# Patient Record
Sex: Female | Born: 1970
Health system: Southern US, Community
[De-identification: ages and names within clinical notes are randomized; demographics above are authoritative.]

## PROBLEM LIST (undated history)

## (undated) DIAGNOSIS — T7840XA Allergy, unspecified, initial encounter: Secondary | ICD-10-CM

## (undated) HISTORY — DX: Allergy, unspecified, initial encounter: T78.40XA

## (undated) HISTORY — PX: BREAST ENHANCEMENT SURGERY: SHX7

## (undated) HISTORY — PX: APPENDECTOMY: SHX54

---

## 1997-01-07 HISTORY — PX: DILATION AND CURETTAGE OF UTERUS: SHX78

## 1997-01-07 HISTORY — PX: ABDOMINAL HYSTERECTOMY: SHX81

## 2007-06-26 ENCOUNTER — Other Ambulatory Visit: Admission: RE | Admit: 2007-06-26 | Discharge: 2007-06-26 | Payer: Self-pay | Admitting: Family Medicine

## 2008-01-08 HISTORY — PX: ESOPHAGOGASTRODUODENOSCOPY: SHX1529

## 2008-12-28 ENCOUNTER — Encounter: Admission: RE | Admit: 2008-12-28 | Discharge: 2008-12-28 | Payer: Self-pay | Admitting: Gastroenterology

## 2009-02-23 ENCOUNTER — Emergency Department: Payer: Self-pay | Admitting: Emergency Medicine

## 2009-06-17 ENCOUNTER — Emergency Department: Payer: Self-pay | Admitting: Emergency Medicine

## 2010-01-28 ENCOUNTER — Encounter: Payer: Self-pay | Admitting: Gastroenterology

## 2010-06-19 ENCOUNTER — Emergency Department (HOSPITAL_COMMUNITY)
Admission: EM | Admit: 2010-06-19 | Discharge: 2010-06-19 | Disposition: A | Discharge status: Home / Self Care | Payer: PRIVATE HEALTH INSURANCE | Attending: Emergency Medicine | Admitting: Emergency Medicine

## 2010-06-19 ENCOUNTER — Emergency Department (HOSPITAL_COMMUNITY): Payer: PRIVATE HEALTH INSURANCE

## 2010-06-19 DIAGNOSIS — R209 Unspecified disturbances of skin sensation: Secondary | ICD-10-CM | POA: Insufficient documentation

## 2010-06-19 DIAGNOSIS — M549 Dorsalgia, unspecified: Secondary | ICD-10-CM | POA: Insufficient documentation

## 2010-06-19 DIAGNOSIS — R079 Chest pain, unspecified: Secondary | ICD-10-CM | POA: Insufficient documentation

## 2010-06-19 DIAGNOSIS — M25519 Pain in unspecified shoulder: Secondary | ICD-10-CM | POA: Insufficient documentation

## 2010-06-19 DIAGNOSIS — R110 Nausea: Secondary | ICD-10-CM | POA: Insufficient documentation

## 2010-06-19 LAB — DIFFERENTIAL
Basophils Absolute: 0 10*3/uL (ref 0.0–0.1)
Eosinophils Absolute: 0.2 10*3/uL (ref 0.0–0.7)
Monocytes Absolute: 0.3 10*3/uL (ref 0.1–1.0)

## 2010-06-19 LAB — CBC
Hemoglobin: 13.1 g/dL (ref 12.0–15.0)
MCHC: 34.3 g/dL (ref 30.0–36.0)
MCV: 98.5 fL (ref 78.0–100.0)
Platelets: 257 10*3/uL (ref 150–400)
WBC: 5.5 10*3/uL (ref 4.0–10.5)

## 2010-06-19 LAB — BASIC METABOLIC PANEL
BUN: 11 mg/dL (ref 6–23)
GFR calc Af Amer: >60 mL/min (ref >60)
GFR calc non Af Amer: >60 mL/min (ref >60)
Potassium: 3.9 mEq/L (ref 3.5–5.1)
Sodium: 139 mEq/L (ref 135–145)

## 2010-06-26 ENCOUNTER — Encounter: Payer: Self-pay | Admitting: Cardiovascular Disease

## 2010-06-27 ENCOUNTER — Ambulatory Visit (INDEPENDENT_AMBULATORY_CARE_PROVIDER_SITE_OTHER): Payer: PRIVATE HEALTH INSURANCE | Admitting: Cardiovascular Disease

## 2010-06-27 ENCOUNTER — Encounter: Payer: Self-pay | Admitting: Cardiovascular Disease

## 2010-06-27 DIAGNOSIS — R079 Chest pain, unspecified: Secondary | ICD-10-CM

## 2010-06-27 NOTE — Assessment & Plan Note (Signed)
Atypical pain in a young female with no cardiac risk factors. I do not think this is cardiac. She will call us if she has any recurrence of her chest pain.

## 2010-06-27 NOTE — Progress Notes (Signed)
History of Present Illness:40 yo AAF with no past medical history who is here today for evaluation of chest pressure. She was at home and doing schoolwork when she began to feel a heaviness in her chest. Described as a squeezing type pain. Position changes did not help. There was some radiation into her left arm. This episode lasted for 20-30 minutes. She was seen in the [facility] ED 06/19/10 and had negative cardiac enzymes, negative d-dimer. EKG showed sinus tachycardia. She has had no recurrence. The pain occurred when she was anxious, studying and drinking coffee.   No past medical history on file.  Past Surgical History  Procedure Date  . Breast enhancement surgery   . Abdominal hysterectomy   . Appendectomy     No current outpatient prescriptions on file.    Allergies  Allergen Reactions  . Morphine And Related     History   Social History  . Marital Status: Single    Spouse Name: N/A    Number of Children: N/A  . Years of Education: N/A   Occupational History  . Not on file.   Social History Main Topics  . Smoking status: Former Smoker    Quit date: 01/08/1991  . Smokeless tobacco: Not on file  . Alcohol Use: Not on file  . Drug Use: Not on file  . Sexually Active: Not on file   Other Topics Concern  . Not on file   Social History Narrative   non-smoker, no drug abuse, occasional drinker    Family History  Problem Relation Age of Onset  . Cancer Mother   . Diabetes Father     Review of Systems:  As stated in the HPI and otherwise negative.   BP 122/82  Pulse 74  Ht 5\' 10"  (1.778 m)  Wt 169 lb (76.658 kg)  BMI 24.25 kg/m2  Physical Examination: General: Well developed, well nourished, NAD HEENT: OP clear, mucus membranes moist SKIN: warm, dry. No rashes. Neuro: No focal deficits Musculoskeletal: Muscle strength 5/5 all ext Psychiatric: Mood and affect normal Neck: No JVD, no carotid bruits, no thyromegaly, no lymphadenopathy. Lungs:Clear  bilaterally, no wheezes, rhonci, crackles Cardiovascular: Regular rate and rhythm. No murmurs, gallops or rubs. Abdomen:Soft. Bowel sounds present. Non-tender.  Extremities: No lower extremity edema. Pulses are 2 + in the bilateral DP/PT.  EKG: Sinus tach 06/19/10.

## 2010-10-08 ENCOUNTER — Encounter: Payer: Self-pay | Admitting: Family Medicine

## 2010-10-08 ENCOUNTER — Ambulatory Visit (INDEPENDENT_AMBULATORY_CARE_PROVIDER_SITE_OTHER): Payer: PRIVATE HEALTH INSURANCE | Admitting: Family Medicine

## 2010-10-08 VITALS — BP 124/78 | HR 87 | Temp 99.1°F | Ht 69.75 in | Wt 169.0 lb

## 2010-10-08 DIAGNOSIS — Z0000 Encounter for general adult medical examination without abnormal findings: Secondary | ICD-10-CM

## 2010-10-08 NOTE — Progress Notes (Signed)
  Subjective:    Patient ID: Lori Copeland, female    DOB: [birth year], 40 y.o.   MRN: [id number]  HPI 40 yr old female to establish with [place] and for a cpx. She feels fine and has no complaints. She does have some questions about her family history and about what would be appropriate screens for her to get. Her mother died of stomach cancer at the age of 7, and her sister has ovarian cancer at the age of 56. Lori Copeland has had a TAH and has had one ovary removed. She has never had a mammogram.    Review of Systems  Constitutional: Negative.   HENT: Negative.   Eyes: Negative.   Respiratory: Negative.   Cardiovascular: Negative.   Gastrointestinal: Negative.   Genitourinary: Negative for dysuria, urgency, frequency, hematuria, flank pain, decreased urine volume, enuresis, difficulty urinating, pelvic pain and dyspareunia.  Musculoskeletal: Negative.   Skin: Negative.   Neurological: Negative.   Hematological: Negative.   Psychiatric/Behavioral: Negative.        Objective:   Physical Exam  Constitutional: She is oriented to person, place, and time. She appears well-developed and well-nourished. No distress.  HENT:  Head: Normocephalic and atraumatic.  Right Ear: External ear normal.  Left Ear: External ear normal.  Nose: Nose normal.  Mouth/Throat: Oropharynx is clear and moist. No oropharyngeal exudate.  Eyes: Conjunctivae and EOM are normal. Pupils are equal, round, and reactive to light. No scleral icterus.  Neck: Normal range of motion. Neck supple. No JVD present. No thyromegaly present.  Cardiovascular: Normal rate, regular rhythm, normal heart sounds and intact distal pulses.  Exam reveals no gallop and no friction rub.   No murmur heard. Pulmonary/Chest: Effort normal and breath sounds normal. No respiratory distress. She has no wheezes. She has no rales. She exhibits no tenderness.  Abdominal: Soft. Bowel sounds are normal. She exhibits no distension and no mass. There is no  tenderness. There is no rebound and no guarding.  Genitourinary: No breast swelling, tenderness, discharge or bleeding.  Musculoskeletal: Normal range of motion. She exhibits no edema and no tenderness.  Lymphadenopathy:    She has no cervical adenopathy.  Neurological: She is alert and oriented to person, place, and time. She has normal reflexes. No cranial nerve deficit. She exhibits normal muscle tone. Coordination normal.  Skin: Skin is warm and dry. No rash noted. No erythema.  Psychiatric: She has a normal mood and affect. Her behavior is normal. Judgment and thought content normal.          Assessment & Plan:  She will be having fasting labs drawn soon through a work related wellness program, and she will have a coopy of these sent to [place]. I wrote an order for her to add a CA-125 to these labs. She will get a mammogram soon.

## 2010-10-20 ENCOUNTER — Ambulatory Visit: Payer: Self-pay | Admitting: Family Medicine

## 2016-08-16 DIAGNOSIS — J040 Acute laryngitis: Secondary | ICD-10-CM | POA: Diagnosis not present

## 2016-08-16 DIAGNOSIS — J029 Acute pharyngitis, unspecified: Secondary | ICD-10-CM | POA: Diagnosis not present

## 2016-09-26 ENCOUNTER — Encounter: Payer: Self-pay | Admitting: Family Medicine

## 2017-07-06 ENCOUNTER — Ambulatory Visit: Payer: Self-pay | Admitting: Family Medicine

## 2017-07-06 VITALS — BP 122/80 | HR 105 | Temp 98.7°F | Resp 18 | Ht 70.0 in | Wt 185.2 lb

## 2017-07-06 DIAGNOSIS — Z0000 Encounter for general adult medical examination without abnormal findings: Secondary | ICD-10-CM

## 2017-07-06 NOTE — Progress Notes (Signed)
Lori Copeland is a 47 y.o. female who presents today with concerns of an employment physical exam. He PCP dr Abran CantorFrye but she reports she had not seen since 2012.   Review of Systems  Constitutional: Negative for chills, fever and malaise/fatigue.  HENT: Negative for congestion, ear discharge, ear pain, sinus pain and sore throat.   Eyes: Negative.   Respiratory: Negative for cough, sputum production and shortness of breath.   Cardiovascular: Negative.  Negative for chest pain.  Gastrointestinal: Negative for abdominal pain, diarrhea, nausea and vomiting.  Genitourinary: Negative for dysuria, frequency, hematuria and urgency.  Musculoskeletal: Negative for myalgias.  Skin: Negative.   Neurological: Negative for headaches.  Endo/Heme/Allergies: Negative.   Psychiatric/Behavioral: Negative.     O: Vitals:   07/06/17 1540  BP: 122/80  Pulse: (!) 105  Resp: 18  Temp: 98.7 F (37.1 C)  SpO2: 96%     Physical Exam  Constitutional: She is oriented to person, place, and time. Vital signs are normal. She appears well-developed and well-nourished. She is active.  Non-toxic appearance. She does not have a sickly appearance.  HENT:  Head: Normocephalic.  Right Ear: Hearing, tympanic membrane, external ear and ear canal normal.  Left Ear: Hearing, tympanic membrane, external ear and ear canal normal.  Nose: Nose normal.  Mouth/Throat: Uvula is midline and oropharynx is clear and moist.  Neck: Normal range of motion. Neck supple.  Cardiovascular: Normal rate, regular rhythm, normal heart sounds and normal pulses.  Pulmonary/Chest: Effort normal and breath sounds normal.  Abdominal: Soft. Bowel sounds are normal.  Musculoskeletal: Normal range of motion.  Lymphadenopathy:       Head (right side): No submental and no submandibular adenopathy present.       Head (left side): No submental and no submandibular adenopathy present.    She has no cervical adenopathy.  Neurological: She is alert  and oriented to person, place, and time.  Psychiatric: She has a normal mood and affect. Her speech is normal and behavior is normal. Cognition and memory are normal.  PHQ-9- negative  Vitals reviewed.  A: 1. Physical exam    P: Exam findings, diagnosis etiology and medication use and indications reviewed with patient. Follow- Up and discharge instructions provided. No emergent/urgent issues found on exam.  Patient verbalized understanding of information provided and agrees with plan of care (POC), all questions answered.  1. Physical exam WNL

## 2017-07-06 NOTE — Patient Instructions (Addendum)
Food Choices to Help Relieve Diarrhea, Adult When you have diarrhea, the foods you eat and your eating habits are very important. Choosing the right foods and drinks can help:  Relieve diarrhea.  Replace lost fluids and nutrients.  Prevent dehydration.  What general guidelines should I follow? Relieving diarrhea  Choose foods with less than 2 g or .07 oz. of fiber per serving.  Limit fats to less than 8 tsp (38 g or 1.34 oz.) a day.  Avoid the following: ? Foods and beverages sweetened with high-fructose corn syrup, honey, or sugar alcohols such as xylitol, sorbitol, and mannitol. ? Foods that contain a lot of fat or sugar. ? Fried, greasy, or spicy foods. ? High-fiber grains, breads, and cereals. ? Raw fruits and vegetables.  Eat foods that are rich in probiotics. These foods include dairy products such as yogurt and fermented milk products. They help increase healthy bacteria in the stomach and intestines (gastrointestinal tract, or GI tract).  If you have lactose intolerance, avoid dairy products. These may make your diarrhea worse.  Take medicine to help stop diarrhea (antidiarrheal medicine) only as told by your health care provider. Replacing nutrients  Eat small meals or snacks every 3-4 hours.  Eat bland foods, such as white rice, toast, or baked potato, until your diarrhea starts to get better. Gradually reintroduce nutrient-rich foods as tolerated or as told by your health care provider. This includes: ? Well-cooked protein foods. ? Peeled, seeded, and soft-cooked fruits and vegetables. ? Low-fat dairy products.  Take vitamin and mineral supplements as told by your health care provider. Preventing dehydration   Start by sipping water or a special solution to prevent dehydration (oral rehydration solution, ORS). Urine that is clear or pale yellow means that you are getting enough fluid.  Try to drink at least 8-10 cups of fluid each day to help replace lost  fluids.  You may add other liquids in addition to water, such as clear juice or decaffeinated sports drinks, as tolerated or as told by your health care provider.  Avoid drinks with caffeine, such as coffee, tea, or soft drinks.  Avoid alcohol. What foods are recommended? The items listed may not be a complete list. Talk with your health care provider about what dietary choices are best for you. Grains White rice. White, [place], or pita breads (fresh or toasted), including plain rolls, buns, or bagels. White pasta. Saltine, soda, or Lalana Wachter crackers. Pretzels. Low-fiber cereal. Cooked cereals made with water (such as cornmeal, farina, or cream cereals). Plain muffins. Matzo. Melba toast. Zwieback. Vegetables Potatoes (without the skin). Most well-cooked and canned vegetables without skins or seeds. Tender lettuce. Fruits Apple sauce. Fruits canned in juice. Cooked apricots, cherries, grapefruit, peaches, pears, or plums. Fresh bananas and cantaloupe. Meats and other protein foods Baked or boiled chicken. Eggs. Tofu. Fish. Seafood. Smooth nut butters. Ground or well-cooked tender beef, ham, veal, lamb, pork, or poultry. Dairy Plain yogurt, kefir, and unsweetened liquid yogurt. Lactose-free milk, buttermilk, skim milk, or soy milk. Low-fat or nonfat hard cheese. Beverages Water. Low-calorie sports drinks. Fruit juices without pulp. Strained tomato and vegetable juices. Decaffeinated teas. Sugar-free beverages not sweetened with sugar alcohols. Oral rehydration solutions, if approved by your health care provider. Seasoning and other foods Bouillon, broth, or soups made from recommended foods. What foods are not recommended? The items listed may not be a complete list. Talk with your health care provider about what dietary choices are best for you. Grains Whole grain, whole wheat,  bran, or rye breads, rolls, pastas, and crackers. Wild or brown rice. Whole grain or bran cereals. Barley. Oats and  oatmeal. Corn tortillas or taco shells. Granola. Popcorn. Vegetables Raw vegetables. Fried vegetables. Cabbage, broccoli, Brussels sprouts, artichokes, baked beans, beet greens, corn, kale, legumes, peas, sweet potatoes, and yams. Potato skins. Cooked spinach and cabbage. Fruits Dried fruit, including raisins and dates. Raw fruits. Stewed or dried prunes. Canned fruits with syrup. Meat and other protein foods Fried or fatty meats. Deli meats. Chunky nut butters. Nuts and seeds. Beans and lentils. Berniece Salines. Hot dogs. Sausage. Dairy High-fat cheeses. Whole milk, chocolate milk, and beverages made with milk, such as milk shakes. Half-and-half. Cream. sour cream. Ice cream. Beverages Caffeinated beverages (such as coffee, tea, soda, or energy drinks). Alcoholic beverages. Fruit juices with pulp. Prune juice. Soft drinks sweetened with high-fructose corn syrup or sugar alcohols. High-calorie sports drinks. Fats and oils Butter. Cream sauces. Margarine. Salad oils. Plain salad dressings. Olives. Avocados. Mayonnaise. Sweets and desserts Sweet rolls, doughnuts, and sweet breads. Sugar-free desserts sweetened with sugar alcohols such as xylitol and sorbitol. Seasoning and other foods Honey. Hot sauce. Chili powder. Gravy. Cream-based or milk-based soups. Pancakes and waffles. Summary  When you have diarrhea, the foods you eat and your eating habits are very important.  Make sure you get at least 8-10 cups of fluid each day, or enough to keep your urine clear or pale yellow.  Eat bland foods and gradually reintroduce healthy, nutrient-rich foods as tolerated, or as told by your health care provider.  Avoid high-fiber, fried, greasy, or spicy foods. This information is not intended to replace advice given to you by your health care provider. Make sure you discuss any questions you have with your health care provider. Document Released: 03/16/2003 Document Revised: 12/22/2015 Document Reviewed:  12/22/2015 Elsevier Interactive Patient Education  2018 [place] Maintenance, Female Adopting a healthy lifestyle and getting preventive care can go a long way to promote health and wellness. Talk with your health care provider about what schedule of regular examinations is right for you. This is a good chance for you to check in with your provider about disease prevention and staying healthy. In between checkups, there are plenty of things you can do on your own. Experts have done a lot of research about which lifestyle changes and preventive measures are most likely to keep you healthy. Ask your health care provider for more information. Weight and diet Eat a healthy diet  Be sure to include plenty of vegetables, fruits, low-fat dairy products, and lean protein.  Do not eat a lot of foods high in solid fats, added sugars, or salt.  Get regular exercise. This is one of the most important things you can do for your health. ? Most adults should exercise for at least 150 minutes each week. The exercise should increase your heart rate and make you sweat (moderate-intensity exercise). ? Most adults should also do strengthening exercises at least twice a week. This is in addition to the moderate-intensity exercise.  Maintain a healthy weight  Body mass index (BMI) is a measurement that can be used to identify possible weight problems. It estimates body fat based on height and weight. Your health care provider can help determine your BMI and help you achieve or maintain a healthy weight.  For females 71 years of age and older: ? A BMI below 18.5 is considered underweight. ? A BMI of 18.5 to 24.9 is normal. ? A BMI of 25  to 29.9 is considered overweight. ? A BMI of 30 and above is considered obese.  Watch levels of cholesterol and blood lipids  You should start having your blood tested for lipids and cholesterol at 45 years of age, then have this test every 5 years.  You may need  to have your cholesterol levels checked more often if: ? Your lipid or cholesterol levels are high. ? You are older than 45 years of age. ? You are at high risk for heart disease.  Cancer screening Lung Cancer  Lung cancer screening is recommended for adults 89-70 years old who are at high risk for lung cancer because of a history of smoking.  A yearly low-dose CT scan of the lungs is recommended for people who: ? Currently smoke. ? Have quit within the past 15 years. ? Have at least a 30-pack-year history of smoking. A pack year is smoking an average of one pack of cigarettes a day for 1 year.  Yearly screening should continue until it has been 15 years since you quit.  Yearly screening should stop if you develop a health problem that would prevent you from having lung cancer treatment.  Breast Cancer  Practice breast self-awareness. This means understanding how your breasts normally appear and feel.  It also means doing regular breast self-exams. Let your health care provider know about any changes, no matter how small.  If you are in your 20s or 30s, you should have a clinical breast exam (CBE) by a health care provider every 1-3 years as part of a regular health exam.  If you are 101 or older, have a CBE every year. Also consider having a breast X-ray (mammogram) every year.  If you have a family history of breast cancer, talk to your health care provider about genetic screening.  If you are at high risk for breast cancer, talk to your health care provider about having an MRI and a mammogram every year.  Breast cancer gene (BRCA) assessment is recommended for women who have family members with BRCA-related cancers. BRCA-related cancers include: ? Breast. ? Ovarian. ? Tubal. ? Peritoneal cancers.  Results of the assessment will determine the need for genetic counseling and BRCA1 and BRCA2 testing.  Cervical Cancer Your health care provider may recommend that you be  screened regularly for cancer of the pelvic organs (ovaries, uterus, and vagina). This screening involves a pelvic examination, including checking for microscopic changes to the surface of your cervix (Pap test). You may be encouraged to have this screening done every 3 years, beginning at age 45.  For women ages 60-65, health care providers may recommend pelvic exams and Pap testing every 3 years, or they may recommend the Pap and pelvic exam, combined with testing for human papilloma virus (HPV), every 5 years. Some types of HPV increase your risk of cervical cancer. Testing for HPV may also be done on women of any age with unclear Pap test results.  Other health care providers may not recommend any screening for nonpregnant women who are considered low risk for pelvic cancer and who do not have symptoms. Ask your health care provider if a screening pelvic exam is right for you.  If you have had past treatment for cervical cancer or a condition that could lead to cancer, you need Pap tests and screening for cancer for at least 20 years after your treatment. If Pap tests have been discontinued, your risk factors (such as having a new sexual partner) need  to be reassessed to determine if screening should resume. Some women have medical problems that increase the chance of getting cervical cancer. In these cases, your health care provider may recommend more frequent screening and Pap tests.  Colorectal Cancer  This type of cancer can be detected and often prevented.  Routine colorectal cancer screening usually begins at 45 years of age and continues through 45 years of age.  Your health care provider may recommend screening at an earlier age if you have risk factors for colon cancer.  Your health care provider may also recommend using home test kits to check for hidden blood in the stool.  A small camera at the end of a tube can be used to examine your colon directly (sigmoidoscopy or colonoscopy).  This is done to check for the earliest forms of colorectal cancer.  Routine screening usually begins at age 82.  Direct examination of the colon should be repeated every 5-10 years through 44 years of age. However, you may need to be screened more often if early forms of precancerous polyps or small growths are found.  Skin Cancer  Check your skin from head to toe regularly.  Tell your health care provider about any new moles or changes in moles, especially if there is a change in a mole's shape or color.  Also tell your health care provider if you have a mole that is larger than the size of a pencil eraser.  Always use sunscreen. Apply sunscreen liberally and repeatedly throughout the day.  Protect yourself by wearing long sleeves, pants, a wide-brimmed hat, and sunglasses whenever you are outside.  Heart disease, diabetes, and high blood pressure  High blood pressure causes heart disease and increases the risk of stroke. High blood pressure is more likely to develop in: ? People who have blood pressure in the high end of the normal range (130-139/85-89 mm Hg). ? People who are overweight or obese. ? People who are African American.  If you are 55-78 years of age, have your blood pressure checked every 3-5 years. If you are 74 years of age or older, have your blood pressure checked every year. You should have your blood pressure measured twice-once when you are at a hospital or clinic, and once when you are not at a hospital or clinic. Record the average of the two measurements. To check your blood pressure when you are not at a hospital or clinic, you can use: ? An automated blood pressure machine at a pharmacy. ? A home blood pressure monitor.  If you are between 27 years and 53 years old, ask your health care provider if you should take aspirin to prevent strokes.  Have regular diabetes screenings. This involves taking a blood sample to check your fasting blood sugar level. ? If  you are at a normal weight and have a low risk for diabetes, have this test once every three years after 45 years of age. ? If you are overweight and have a high risk for diabetes, consider being tested at a younger age or more often. Preventing infection Hepatitis B  If you have a higher risk for hepatitis B, you should be screened for this virus. You are considered at high risk for hepatitis B if: ? You were born in a country where hepatitis B is common. Ask your health care provider which countries are considered high risk. ? Your parents were born in a high-risk country, and you have not been immunized against hepatitis B (  hepatitis B vaccine). ? You have HIV or AIDS. ? You use needles to inject street drugs. ? You live with someone who has hepatitis B. ? You have had sex with someone who has hepatitis B. ? You get hemodialysis treatment. ? You take certain medicines for conditions, including cancer, organ transplantation, and autoimmune conditions.  Hepatitis C  Blood testing is recommended for: ? Everyone born from [birth year] through 1963. ? Anyone with known risk factors for hepatitis C.  Sexually transmitted infections (STIs)  You should be screened for sexually transmitted infections (STIs) including gonorrhea and chlamydia if: ? You are sexually active and are younger than 45 years of age. ? You are older than 45 years of age and your health care provider tells you that you are at risk for this type of infection. ? Your sexual activity has changed since you were last screened and you are at an increased risk for chlamydia or gonorrhea. Ask your health care provider if you are at risk.  If you do not have HIV, but are at risk, it may be recommended that you take a prescription medicine daily to prevent HIV infection. This is called pre-exposure prophylaxis (PrEP). You are considered at risk if: ? You are sexually active and do not regularly use condoms or know the HIV status of your  partner(s). ? You take drugs by injection. ? You are sexually active with a partner who has HIV.  Talk with your health care provider about whether you are at high risk of being infected with HIV. If you choose to begin PrEP, you should first be tested for HIV. You should then be tested every 3 months for as long as you are taking PrEP. Pregnancy  If you are premenopausal and you may become pregnant, ask your health care provider about preconception counseling.  If you may become pregnant, take 400 to 800 micrograms (mcg) of folic acid every day.  If you want to prevent pregnancy, talk to your health care provider about birth control (contraception). Osteoporosis and menopause  Osteoporosis is a disease in which the bones lose minerals and strength with aging. This can result in serious bone fractures. Your risk for osteoporosis can be identified using a bone density scan.  If you are 67 years of age or older, or if you are at risk for osteoporosis and fractures, ask your health care provider if you should be screened.  Ask your health care provider whether you should take a calcium or vitamin D supplement to lower your risk for osteoporosis.  Menopause may have certain physical symptoms and risks.  Hormone replacement therapy may reduce some of these symptoms and risks. Talk to your health care provider about whether hormone replacement therapy is right for you. Follow these instructions at home:  Schedule regular health, dental, and eye exams.  Stay current with your immunizations.  Do not use any tobacco products including cigarettes, chewing tobacco, or electronic cigarettes.  If you are pregnant, do not drink alcohol.  If you are breastfeeding, limit how much and how often you drink alcohol.  Limit alcohol intake to no more than 1 drink per day for nonpregnant women. One drink equals 12 ounces of beer, 5 ounces of wine, or 1 ounces of hard liquor.  Do not use street  drugs.  Do not share needles.  Ask your health care provider for help if you need support or information about quitting drugs.  Tell your health care provider if you often feel  depressed.  Tell your health care provider if you have ever been abused or do not feel safe at home. This information is not intended to replace advice given to you by your health care provider. Make sure you discuss any questions you have with your health care provider. Document Released: 07/09/2010 Document Revised: 06/01/2015 Document Reviewed: 09/27/2014 Elsevier Interactive Patient Education  [employer].

## 2018-02-05 ENCOUNTER — Ambulatory Visit (HOSPITAL_COMMUNITY)
Admission: EM | Admit: 2018-02-05 | Discharge: 2018-02-05 | Disposition: A | Discharge status: Home / Self Care | Payer: 59 | Attending: Family Medicine | Admitting: Family Medicine

## 2018-02-05 ENCOUNTER — Encounter (HOSPITAL_COMMUNITY): Payer: Self-pay | Admitting: Emergency Medicine

## 2018-02-05 DIAGNOSIS — J069 Acute upper respiratory infection, unspecified: Secondary | ICD-10-CM | POA: Insufficient documentation

## 2018-02-05 DIAGNOSIS — B9789 Other viral agents as the cause of diseases classified elsewhere: Secondary | ICD-10-CM | POA: Insufficient documentation

## 2018-02-05 MED ORDER — ALBUTEROL SULFATE HFA 108 (90 BASE) MCG/ACT IN AERS: 2.0000 | INHALATION_SPRAY | RESPIRATORY_TRACT | 1 refills | Status: DC | PRN

## 2018-02-05 MED ORDER — BENZONATATE 100 MG PO CAPS: 100.0000 mg | ORAL_CAPSULE | Freq: Three times a day (TID) | ORAL | 0 refills | Status: DC | PRN

## 2018-02-05 NOTE — ED Provider Notes (Signed)
[facility]    CSN: [id number] Arrival date & time: 02/05/18  1534     History   Chief Complaint Chief Complaint  Patient presents with  . Appointment    330  . Cough    HPI Orvan SeenYakana D Copeland is a 48 y.o. female.   This is the initial visit for this 48 year old woman who presents with a two month of cough which has been characterized as coming and going and dry.  Most recent episode started Saturday.  It is worse at night.  Yellow and clear sputum  Associated with fatigue.  Has tried dextromethorphan, guaiafenecin, benadryl without relief  Occasional central chest pain   No sore throat.  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     Past Medical History:  Diagnosis Date  . Allergy   . Chest pain 2012   normal work up, non cardiac     Patient Active Problem List   Diagnosis Date Noted  . Chest pain 06/27/2010    Past Surgical History:  Procedure Laterality Date  . ABDOMINAL HYSTERECTOMY  1999   left ovary still intact   . APPENDECTOMY    . BREAST ENHANCEMENT SURGERY    . DILATION AND CURETTAGE OF UTERUS  1999  . ESOPHAGOGASTRODUODENOSCOPY  2010   normal    OB History   No obstetric history on file.      Home Medications    Prior to Admission medications   Medication Sig Start Date End Date Taking? Authorizing Provider  albuterol (PROVENTIL HFA;VENTOLIN HFA) 108 (90 Base) MCG/ACT inhaler Inhale 2 puffs into the lungs every 4 (four) hours as needed for wheezing or shortness of breath (cough, shortness of breath or wheezing.). 02/05/18   Elvina SidleLauenstein, Lincon Sahlin, MD  benzonatate (TESSALON) 100 MG capsule Take 1-2 capsules (100-200 mg total) by mouth 3 (three) times daily as needed for cough. 02/05/18   Elvina SidleLauenstein, Aasia Peavler, MD    Family History Family History  Problem Relation Age of Onset  . Cancer Mother   . Alcohol abuse Mother   . Stomach cancer Mother   . Diabetes Father   . Diabetes Paternal Grandmother   . Ovarian cancer Sister     Social  History Social History   Tobacco Use  . Smoking status: Former Smoker    Last attempt to quit: 01/08/1991    Years since quitting: 27.0  . Smokeless tobacco: Never Used  Substance Use Topics  . Alcohol use: Yes  . Drug use: No     Allergies   Latex; Morphine and related; and Other   Review of Systems Review of Systems   Physical Exam Triage Vital Signs ED Triage Vitals  Enc Vitals Group     BP 02/05/18 1610 123/85     Pulse Rate 02/05/18 1610 80     Resp 02/05/18 1610 16     Temp 02/05/18 1610 97.7 F (36.5 C)     Temp Source 02/05/18 1610 Oral     SpO2 02/05/18 1610 98 %     Weight --      Height --      Head Circumference --      Peak Flow --      Pain Score 02/05/18 1612 0     Pain Loc --      Pain Edu? --      Excl. in GC? --    No data found.  Updated Vital Signs BP 123/85 (BP Location: Left Arm)   Pulse 80  Temp 97.7 F (36.5 C) (Oral)   Resp 16   SpO2 98%    Physical Exam Vitals signs and nursing note reviewed.  Constitutional:      Appearance: Normal appearance.  HENT:     Head: Normocephalic.     Mouth/Throat:     Mouth: Mucous membranes are moist.  Eyes:     Conjunctiva/sclera: Conjunctivae normal.  Neck:     Musculoskeletal: Normal range of motion.  Cardiovascular:     Rate and Rhythm: Normal rate and regular rhythm.     Heart sounds: Normal heart sounds.  Pulmonary:     Effort: Pulmonary effort is normal.     Breath sounds: Normal breath sounds.  Musculoskeletal: Normal range of motion.  Skin:    General: Skin is warm.  Neurological:     General: No focal deficit present.     Mental Status: She is alert.  Psychiatric:        Mood and Affect: Mood normal.      UC Treatments / Results  Labs (all labs ordered are listed, but only abnormal results are displayed) Labs Reviewed - No data to display  EKG None  Radiology No results found.  Procedures Procedures (including critical care time)  Medications Ordered in  UC Medications - No data to display  Initial Impression / Assessment and Plan / UC Course  I have reviewed the triage vital signs and the nursing notes.  Pertinent labs & imaging results that were available during my care of the patient were reviewed by me and considered in my medical decision making (see chart for details).    Final Clinical Impressions(s) / UC Diagnoses   Final diagnoses:  Viral URI with cough   Discharge Instructions   None    ED Prescriptions    Medication Sig Dispense Auth. Provider   benzonatate (TESSALON) 100 MG capsule Take 1-2 capsules (100-200 mg total) by mouth 3 (three) times daily as needed for cough. 40 capsule Elvina Sidle, MD   albuterol (PROVENTIL HFA;VENTOLIN HFA) 108 (90 Base) MCG/ACT inhaler Inhale 2 puffs into the lungs every 4 (four) hours as needed for wheezing or shortness of breath (cough, shortness of breath or wheezing.). 1 Inhaler Elvina Sidle, MD     Controlled Substance Prescriptions [place] Controlled Substance Registry consulted? Not Applicable   Elvina Sidle, MD 02/05/18 [phone]

## 2018-02-05 NOTE — ED Triage Notes (Signed)
Pt c/o dry cough x1 month, states it comes and goes.

## 2018-07-21 ENCOUNTER — Ambulatory Visit (INDEPENDENT_AMBULATORY_CARE_PROVIDER_SITE_OTHER): Payer: 59 | Admitting: Family Medicine

## 2018-07-21 ENCOUNTER — Encounter: Payer: Self-pay | Admitting: Family Medicine

## 2018-07-21 ENCOUNTER — Other Ambulatory Visit: Payer: Self-pay

## 2018-07-21 VITALS — BP 110/78 | HR 85 | Temp 98.9°F | Ht 71.0 in | Wt 162.0 lb

## 2018-07-21 DIAGNOSIS — R739 Hyperglycemia, unspecified: Secondary | ICD-10-CM

## 2018-07-21 DIAGNOSIS — Z209 Contact with and (suspected) exposure to unspecified communicable disease: Secondary | ICD-10-CM | POA: Diagnosis not present

## 2018-07-21 DIAGNOSIS — Z0000 Encounter for general adult medical examination without abnormal findings: Secondary | ICD-10-CM | POA: Diagnosis not present

## 2018-07-21 LAB — BASIC METABOLIC PANEL
BUN: 15 mg/dL (ref 6–23)
CO2: 24 mEq/L (ref 19–32)
Calcium: 9.8 mg/dL (ref 8.4–10.5)
Chloride: 103 mEq/L (ref 96–112)
Creatinine, Ser: 0.76 mg/dL (ref 0.40–1.20)
GFR: 99.1 mL/min (ref >60.00)
Glucose, Bld: 81 mg/dL (ref 70–99)
Potassium: 4.1 mEq/L (ref 3.5–5.1)
Sodium: 137 mEq/L (ref 135–145)

## 2018-07-21 LAB — CBC WITH DIFFERENTIAL/PLATELET
Basophils Absolute: 0 10*3/uL (ref 0.0–0.1)
Basophils Relative: 0.2 % (ref 0.0–3.0)
Eosinophils Absolute: 0.1 10*3/uL (ref 0.0–0.7)
Eosinophils Relative: 1 % (ref 0.0–5.0)
HCT: 41.7 % (ref 36.0–46.0)
Hemoglobin: 13.9 g/dL (ref 12.0–15.0)
Lymphocytes Relative: 42.2 % (ref 12.0–46.0)
Lymphs Abs: 2.1 10*3/uL (ref 0.7–4.0)
MCHC: 33.3 g/dL (ref 30.0–36.0)
MCV: 102.1 fl — ABNORMAL HIGH (ref 78.0–100.0)
Monocytes Absolute: 0.3 10*3/uL (ref 0.1–1.0)
Monocytes Relative: 5.1 % (ref 3.0–12.0)
Neutro Abs: 2.6 10*3/uL (ref 1.4–7.7)
Neutrophils Relative %: 51.5 % (ref 43.0–77.0)
Platelets: 222 10*3/uL (ref 150.0–400.0)
RBC: 4.09 Mil/uL (ref 3.87–5.11)
RDW: 12.2 % (ref 11.5–15.5)
WBC: 5 10*3/uL (ref 4.0–10.5)

## 2018-07-21 LAB — LIPID PANEL
Cholesterol: 159 mg/dL (ref 0–200)
HDL: 63.7 mg/dL (ref >39.00)
LDL Cholesterol: 86 mg/dL (ref 0–99)
NonHDL: 95.26
Total CHOL/HDL Ratio: 2
Triglycerides: 45 mg/dL (ref 0.0–149.0)
VLDL: 9 mg/dL (ref 0.0–40.0)

## 2018-07-21 LAB — HEPATIC FUNCTION PANEL
ALT: 15 U/L (ref 0–35)
AST: 16 U/L (ref 0–37)
Albumin: 4.8 g/dL (ref 3.5–5.2)
Alkaline Phosphatase: 77 U/L (ref 39–117)
Bilirubin, Direct: 0.1 mg/dL (ref 0.0–0.3)
Total Bilirubin: 0.6 mg/dL (ref 0.2–1.2)
Total Protein: 7.4 g/dL (ref 6.0–8.3)

## 2018-07-21 LAB — HEMOGLOBIN A1C: Hgb A1c MFr Bld: 4.8 % (ref 4.6–6.5)

## 2018-07-21 LAB — VITAMIN D 25 HYDROXY (VIT D DEFICIENCY, FRACTURES): VITD: 78.93 ng/mL (ref 30.00–100.00)

## 2018-07-21 LAB — TSH: TSH: 1.85 u[IU]/mL (ref 0.35–4.50)

## 2018-07-21 NOTE — Progress Notes (Signed)
   Subjective:    Patient ID: Lori Copeland, female    DOB: [birth year], 48 y.o.   MRN: [id number]  HPI Here for a well exam. She feels fine. She works as an Product manager. She recently saw Dermatology for a rash, and a biopsy revealed this to be lichen planus. She had gained weight up to 186 lbs last year, but she has been slowly losing this again by exercising and following a keto diet with intermittent fasting. She declines to ever have a mammogram, but she does self breast exams at least once a month. She wants to check her glucose because her father has diabetes.    Review of Systems  Constitutional: Negative.   HENT: Negative.   Eyes: Negative.   Respiratory: Negative.   Cardiovascular: Negative.   Gastrointestinal: Negative.   Genitourinary: Negative for decreased urine volume, difficulty urinating, dyspareunia, dysuria, enuresis, flank pain, frequency, hematuria, pelvic pain and urgency.  Musculoskeletal: Negative.   Skin: Negative.   Neurological: Negative.   Psychiatric/Behavioral: Negative.        Objective:   Physical Exam Constitutional:      General: She is not in acute distress.    Appearance: She is well-developed.  HENT:     Head: Normocephalic and atraumatic.     Right Ear: External ear normal.     Left Ear: External ear normal.     Nose: Nose normal.     Mouth/Throat:     Pharynx: No oropharyngeal exudate.  Eyes:     General: No scleral icterus.    Conjunctiva/sclera: Conjunctivae normal.     Pupils: Pupils are equal, round, and reactive to light.  Neck:     Musculoskeletal: Normal range of motion and neck supple.     Thyroid: No thyromegaly.     Vascular: No JVD.  Cardiovascular:     Rate and Rhythm: Normal rate and regular rhythm.     Heart sounds: Normal heart sounds. No murmur. No friction rub. No gallop.   Pulmonary:     Effort: Pulmonary effort is normal. No respiratory distress.     Breath sounds: Normal breath sounds. No wheezing or rales.   Chest:     Chest wall: No tenderness.  Abdominal:     General: Bowel sounds are normal. There is no distension.     Palpations: Abdomen is soft. There is no mass.     Tenderness: There is no abdominal tenderness. There is no guarding or rebound.  Musculoskeletal: Normal range of motion.        General: No tenderness.  Lymphadenopathy:     Cervical: No cervical adenopathy.  Skin:    General: Skin is warm and dry.     Findings: No erythema or rash.  Neurological:     Mental Status: She is alert and oriented to person, place, and time.     Cranial Nerves: No cranial nerve deficit.     Motor: No abnormal muscle tone.     Coordination: Coordination normal.     Deep Tendon Reflexes: Reflexes are normal and symmetric. Reflexes normal.  Psychiatric:        Behavior: Behavior normal.        Thought Content: Thought content normal.        Judgment: Judgment normal.           Assessment & Plan:  Well exam. We discussed diet and exercise. Get fasting labs including an A1c.  Alysia Penna, MD

## 2018-07-22 LAB — SAR COV2 SEROLOGY (COVID19)AB(IGG),IA: SARS CoV2 AB IGG: NEGATIVE

## 2018-09-28 ENCOUNTER — Telehealth: Payer: Self-pay | Admitting: Family Medicine

## 2018-09-28 NOTE — Telephone Encounter (Signed)
The patient faxed FMLA papers   Fax to: [phone]  Disposition: Dr's Folder

## 2018-09-29 ENCOUNTER — Ambulatory Visit: Payer: 59 | Admitting: Family Medicine

## 2018-09-30 ENCOUNTER — Ambulatory Visit (INDEPENDENT_AMBULATORY_CARE_PROVIDER_SITE_OTHER): Payer: 59 | Admitting: Family Medicine

## 2018-09-30 ENCOUNTER — Other Ambulatory Visit: Payer: Self-pay

## 2018-09-30 DIAGNOSIS — F419 Anxiety disorder, unspecified: Secondary | ICD-10-CM

## 2018-09-30 MED ORDER — ESCITALOPRAM OXALATE 10 MG PO TABS: 10.0000 mg | ORAL_TABLET | Freq: Every day | ORAL | 2 refills | Status: DC

## 2018-09-30 MED FILL — ESCITALOPRAM 10 MG TABLET: 10 | 30 days supply | Qty: 30 | Fill #0

## 2018-10-01 ENCOUNTER — Encounter: Payer: Self-pay | Admitting: Family Medicine

## 2018-10-01 DIAGNOSIS — F419 Anxiety disorder, unspecified: Secondary | ICD-10-CM | POA: Insufficient documentation
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
## 2018-10-02 NOTE — Telephone Encounter (Signed)
Pt had an appt for this to be filled out

## 2018-10-05 ENCOUNTER — Encounter: Payer: Self-pay | Admitting: Family Medicine

## 2018-10-08 NOTE — Telephone Encounter (Signed)
Yes please change the date

## 2018-10-27 MED FILL — ESCITALOPRAM 10 MG TABLET: 10 | 30 days supply | Qty: 30 | Fill #1

## 2018-11-27 MED FILL — ESCITALOPRAM 10 MG TABLET: 10 | 30 days supply | Qty: 30 | Fill #2

## 2018-12-28 ENCOUNTER — Other Ambulatory Visit: Payer: Self-pay | Admitting: Family Medicine

## 2018-12-28 MED FILL — ESCITALOPRAM 10 MG TABLET: 10 | 30 days supply | Qty: 30 | Fill #0

## 2019-01-08 DIAGNOSIS — F902 Attention-deficit hyperactivity disorder, combined type: Secondary | ICD-10-CM

## 2019-01-08 HISTORY — DX: Attention-deficit hyperactivity disorder, combined type: F90.2

## 2019-01-11 ENCOUNTER — Encounter: Payer: Self-pay | Admitting: Family Medicine

## 2019-01-11 NOTE — Telephone Encounter (Signed)
Set up a virtual visit to discuss this  

## 2019-01-29 MED FILL — ESCITALOPRAM 10 MG TABLET: 10 | 30 days supply | Qty: 30 | Fill #1

## 2019-02-19 ENCOUNTER — Encounter: Payer: Self-pay | Admitting: Family Medicine

## 2019-02-22 ENCOUNTER — Encounter: Payer: Self-pay | Admitting: Family Medicine

## 2019-02-22 NOTE — Telephone Encounter (Signed)
Unfortunately I cannot share with her what he and I discussed. If they would like to schedule an OV together to see me, I would be happy to do so

## 2019-03-02 MED FILL — ESCITALOPRAM 10 MG TABLET: 10 | 30 days supply | Qty: 30 | Fill #2

## 2019-04-01 ENCOUNTER — Other Ambulatory Visit: Payer: Self-pay | Admitting: Family Medicine

## 2019-04-01 MED FILL — ESCITALOPRAM 10 MG TABLET: 10 | 90 days supply | Qty: 90 | Fill #0

## 2019-05-10 ENCOUNTER — Encounter: Payer: Self-pay | Admitting: Family Medicine

## 2019-05-11 NOTE — Telephone Encounter (Signed)
I will be on the look out

## 2019-05-14 DIAGNOSIS — H5203 Hypermetropia, bilateral: Secondary | ICD-10-CM | POA: Diagnosis not present

## 2019-05-14 DIAGNOSIS — H524 Presbyopia: Secondary | ICD-10-CM | POA: Diagnosis not present

## 2019-05-14 DIAGNOSIS — H52223 Regular astigmatism, bilateral: Secondary | ICD-10-CM | POA: Diagnosis not present

## 2019-05-17 ENCOUNTER — Telehealth: Payer: Self-pay | Admitting: Family Medicine

## 2019-05-17 NOTE — Telephone Encounter (Signed)
pt submitted a new claim for intermittent FMLA through [employer]; want to know the status of paperwork that was faxed over  to Dr. Clent Ridges would like a call  [phone]

## 2019-05-18 NOTE — Telephone Encounter (Signed)
Pt called to check on her FMLA paperwork and that it was faxed on 5/3 and 5/7. Pt stated if they still have not received the paperwork they can contact [employer] at [facility] [phone] ext [phone]  If received the paperwork it can be faxed back to FAX: [phone]   Pt would like a message through my-chart once it is completed because it has to be completed by May 23rd.

## 2019-05-21 NOTE — Telephone Encounter (Signed)
Pt is calling back in and the below msg was given to her.  Pt state that she will call [employer] to see if the have received the pw after being faxed again. Pt state if they have not received it she will give out office a call back.

## 2019-05-21 NOTE — Telephone Encounter (Signed)
Forms were faxed on 5/6. I have re-faxed these this morning. Left message for patient to call back.

## 2019-05-21 NOTE — Telephone Encounter (Signed)
Noted  

## 2019-06-12 ENCOUNTER — Encounter: Payer: Self-pay | Admitting: Family Medicine

## 2019-06-30 MED FILL — ESCITALOPRAM 10 MG TABLET: 10 | 90 days supply | Qty: 90 | Fill #1

## 2019-08-03 ENCOUNTER — Other Ambulatory Visit: Payer: Self-pay

## 2019-08-03 ENCOUNTER — Encounter: Payer: Self-pay | Admitting: Family Medicine

## 2019-08-03 ENCOUNTER — Ambulatory Visit (INDEPENDENT_AMBULATORY_CARE_PROVIDER_SITE_OTHER): Payer: 59 | Admitting: Family Medicine

## 2019-08-03 VITALS — BP 130/70 | HR 78 | Temp 97.8°F | Ht 71.0 in | Wt 185.0 lb

## 2019-08-03 DIAGNOSIS — Z0000 Encounter for general adult medical examination without abnormal findings: Secondary | ICD-10-CM

## 2019-08-03 DIAGNOSIS — Z23 Encounter for immunization: Secondary | ICD-10-CM | POA: Diagnosis not present

## 2019-08-03 LAB — CBC WITH DIFFERENTIAL/PLATELET
Absolute Monocytes: 268 cells/uL (ref 200–950)
Basophils Absolute: 22 cells/uL (ref 0–200)
Basophils Relative: 0.5 %
Eosinophils Absolute: 664 cells/uL — ABNORMAL HIGH (ref 15–500)
Eosinophils Relative: 15.1 %
HCT: 38.2 % (ref 35.0–45.0)
Hemoglobin: 12.7 g/dL (ref 11.7–15.5)
Lymphs Abs: 1786 cells/uL (ref 850–3900)
MCH: 34.6 pg — ABNORMAL HIGH (ref 27.0–33.0)
MCHC: 33.2 g/dL (ref 32.0–36.0)
MCV: 104.1 fL — ABNORMAL HIGH (ref 80.0–100.0)
MPV: 12.3 fL (ref 7.5–12.5)
Monocytes Relative: 6.1 %
Neutro Abs: 1659 cells/uL (ref 1500–7800)
Neutrophils Relative %: 37.7 %
Platelets: 209 10*3/uL (ref 140–400)
RBC: 3.67 10*6/uL — ABNORMAL LOW (ref 3.80–5.10)
RDW: 10.7 % — ABNORMAL LOW (ref 11.0–15.0)
Total Lymphocyte: 40.6 %
WBC: 4.4 10*3/uL (ref 3.8–10.8)

## 2019-08-03 LAB — HEPATIC FUNCTION PANEL
AG Ratio: 1.8 (calc) (ref 1.0–2.5)
ALT: 13 U/L (ref 6–29)
AST: 15 U/L (ref 10–35)
Albumin: 4.1 g/dL (ref 3.6–5.1)
Alkaline phosphatase (APISO): 57 U/L (ref 31–125)
Bilirubin, Direct: 0.1 mg/dL (ref 0.0–0.2)
Globulin: 2.3 g/dL (calc) (ref 1.9–3.7)
Indirect Bilirubin: 0.2 mg/dL (calc) (ref 0.2–1.2)
Total Bilirubin: 0.3 mg/dL (ref 0.2–1.2)
Total Protein: 6.4 g/dL (ref 6.1–8.1)

## 2019-08-03 LAB — BASIC METABOLIC PANEL
BUN: 12 mg/dL (ref 7–25)
CO2: 26 mmol/L (ref 20–32)
Calcium: 8.8 mg/dL (ref 8.6–10.2)
Chloride: 106 mmol/L (ref 98–110)
Creat: 0.7 mg/dL (ref 0.50–1.10)
Glucose, Bld: 94 mg/dL (ref 65–99)
Potassium: 4.3 mmol/L (ref 3.5–5.3)
Sodium: 139 mmol/L (ref 135–146)

## 2019-08-03 LAB — LIPID PANEL
Cholesterol: 153 mg/dL (ref <200)
HDL: 57 mg/dL (ref >=50)
LDL Cholesterol (Calc): 83 mg/dL (calc)
Non-HDL Cholesterol (Calc): 96 mg/dL (calc) (ref <130)
Total CHOL/HDL Ratio: 2.7 (calc) (ref <5.0)
Triglycerides: 58 mg/dL (ref <150)

## 2019-08-03 LAB — TSH: TSH: 0.87 mIU/L

## 2019-08-03 MED ORDER — BUPROPION HCL ER (XL) 150 MG PO TB24: 150.0000 mg | ORAL_TABLET | Freq: Every day | ORAL | 2 refills | Status: DC

## 2019-08-03 MED FILL — buPROPion HCL ER (XL) 150 M: 150 | 30 days supply | Qty: 30 | Fill #0

## 2019-08-03 NOTE — Progress Notes (Signed)
   Subjective:    Patient ID: Lori Copeland, female    DOB: 11/26/1970, 49 y.o.   MRN: [id number]  HPI Here for a well exam. She feels fine physically but she asks if she can try Wellbutrin. Last year she started on Lexapro for anxiety, and this has helped somewhat. However she still feels a little depression at times and she complains of weight gain. She says the Lexapro has caused her to binge eat and even binge shop on the Internet. She sleeps well.    Review of Systems  Constitutional: Negative.   HENT: Negative.   Eyes: Negative.   Respiratory: Negative.   Cardiovascular: Negative.   Gastrointestinal: Negative.   Genitourinary: Negative for decreased urine volume, difficulty urinating, dyspareunia, dysuria, enuresis, flank pain, frequency, hematuria, pelvic pain and urgency.  Musculoskeletal: Negative.   Skin: Negative.   Neurological: Negative.   Psychiatric/Behavioral: Negative.        Objective:   Physical Exam Constitutional:      General: She is not in acute distress.    Appearance: She is well-developed.  HENT:     Head: Normocephalic and atraumatic.     Right Ear: External ear normal.     Left Ear: External ear normal.     Nose: Nose normal.     Mouth/Throat:     Pharynx: No oropharyngeal exudate.  Eyes:     General: No scleral icterus.    Conjunctiva/sclera: Conjunctivae normal.     Pupils: Pupils are equal, round, and reactive to light.  Neck:     Thyroid: No thyromegaly.     Vascular: No JVD.  Cardiovascular:     Rate and Rhythm: Normal rate and regular rhythm.     Heart sounds: Normal heart sounds. No murmur heard.  No friction rub. No gallop.   Pulmonary:     Effort: Pulmonary effort is normal. No respiratory distress.     Breath sounds: Normal breath sounds. No wheezing or rales.  Chest:     Chest wall: No tenderness.  Abdominal:     General: Bowel sounds are normal. There is no distension.     Palpations: Abdomen is soft. There is no mass.      Tenderness: There is no abdominal tenderness. There is no guarding or rebound.  Musculoskeletal:        General: No tenderness. Normal range of motion.     Cervical back: Normal range of motion and neck supple.  Lymphadenopathy:     Cervical: No cervical adenopathy.  Skin:    General: Skin is warm and dry.     Findings: No erythema or rash.  Neurological:     Mental Status: She is alert and oriented to person, place, and time.     Cranial Nerves: No cranial nerve deficit.     Motor: No abnormal muscle tone.     Coordination: Coordination normal.     Deep Tendon Reflexes: Reflexes are normal and symmetric. Reflexes normal.  Psychiatric:        Behavior: Behavior normal.        Thought Content: Thought content normal.        Judgment: Judgment normal.           Assessment & Plan:  Well exam. We discussed diet and exercise. Get fasting labs. For the depression and anxiety we will add Wellbutrin XL 150 mg daily to her Lexapro. She will report back in one month. Gershon Crane, MD

## 2019-08-03 NOTE — Addendum Note (Signed)
Addended by: Lerry Liner on: 08/03/2019 10:45 AM   Modules accepted: Orders

## 2019-08-03 NOTE — Addendum Note (Signed)
Addended by: Lerry Liner on: 08/03/2019 10:46 AM   Modules accepted: Orders

## 2019-08-03 NOTE — Addendum Note (Signed)
Addended by: Solon Augusta on: 08/03/2019 10:44 AM   Modules accepted: Orders

## 2019-08-04 NOTE — Telephone Encounter (Signed)
No, an elevated eosinophil count is usually the sign of allergies in the patient. This is nothing to worry about

## 2019-09-01 MED FILL — buPROPion HCL ER (XL) 150 M: 150 | 30 days supply | Qty: 30 | Fill #1

## 2019-09-23 ENCOUNTER — Encounter: Payer: Self-pay | Admitting: Family Medicine

## 2019-10-01 MED FILL — ESCITALOPRAM 10 MG TABLET: 10 | 30 days supply | Qty: 30 | Fill #2

## 2019-10-01 MED FILL — buPROPion HCL ER (XL) 150 M: 150 | 30 days supply | Qty: 30 | Fill #2

## 2019-10-04 NOTE — Telephone Encounter (Signed)
Noted  

## 2019-11-04 ENCOUNTER — Other Ambulatory Visit: Payer: Self-pay | Admitting: Family Medicine

## 2019-11-04 MED ORDER — BUPROPION HCL ER (XL) 150 MG PO TB24: 150.0000 mg | ORAL_TABLET | Freq: Every day | ORAL | 2 refills | Status: DC

## 2019-11-04 NOTE — Telephone Encounter (Signed)
3x patient called in to get Rx refill request done    Please call and advise and call patient when Rx sent to the pharmacy. Patient is completley out of medications and is starting to have side effects from not having the medication

## 2019-11-04 NOTE — Telephone Encounter (Signed)
buPROPion (WELLBUTRIN XL) 150 MG 24 hr tablet  [id number] [place], [place] - [address] Phone:  [phone]  Fax:  [phone]

## 2019-11-04 NOTE — Addendum Note (Signed)
Addended by: Wilford Corner on: 11/04/2019 04:13 PM   Modules accepted: Orders

## 2019-11-04 NOTE — Telephone Encounter (Signed)
Patient called about the Wellbutrin refill. I advised it was noted to return in 1 month. She says she sent Dr. Clent Ridges a message that it was working on 09/23/19. She says her insurance requires a 90 day supply. [facility] [address], updated chart, Rx sent in.

## 2020-01-25 ENCOUNTER — Other Ambulatory Visit: Payer: Self-pay | Admitting: Family Medicine

## 2020-05-24 ENCOUNTER — Encounter: Payer: Self-pay | Admitting: Family Medicine

## 2020-05-25 ENCOUNTER — Encounter: Payer: Self-pay | Admitting: Family Medicine

## 2020-05-25 DIAGNOSIS — F902 Attention-deficit hyperactivity disorder, combined type: Secondary | ICD-10-CM

## 2020-05-25 NOTE — Telephone Encounter (Signed)
This should resolve on its own. Drink fluids and treat symptomatically (Tylenol for pain, Delsym for cough, etc)

## 2020-05-26 DIAGNOSIS — F902 Attention-deficit hyperactivity disorder, combined type: Secondary | ICD-10-CM | POA: Insufficient documentation

## 2020-05-26 NOTE — Telephone Encounter (Signed)
I have added this diagnosis to her Problem List

## 2020-08-02 ENCOUNTER — Other Ambulatory Visit: Payer: Self-pay | Admitting: Family Medicine

## 2020-08-09 ENCOUNTER — Ambulatory Visit: Payer: 59 | Admitting: Family Medicine

## 2020-09-05 ENCOUNTER — Telehealth (INDEPENDENT_AMBULATORY_CARE_PROVIDER_SITE_OTHER): Payer: BC Managed Care – PPO | Admitting: Family Medicine

## 2020-09-05 ENCOUNTER — Encounter: Payer: Self-pay | Admitting: Family Medicine

## 2020-09-05 DIAGNOSIS — F902 Attention-deficit hyperactivity disorder, combined type: Secondary | ICD-10-CM | POA: Diagnosis not present

## 2020-09-05 DIAGNOSIS — R635 Abnormal weight gain: Secondary | ICD-10-CM

## 2020-09-05 DIAGNOSIS — F419 Anxiety disorder, unspecified: Secondary | ICD-10-CM | POA: Diagnosis not present

## 2020-09-05 MED ORDER — LISDEXAMFETAMINE DIMESYLATE 50 MG PO CAPS: 50.0000 mg | ORAL_CAPSULE | Freq: Every day | ORAL | 0 refills | Status: DC

## 2020-09-05 MED ORDER — NALTREXONE HCL 50 MG PO TABS: 50.0000 mg | ORAL_TABLET | Freq: Every day | ORAL | 2 refills | Status: DC

## 2020-09-05 NOTE — Progress Notes (Signed)
Subjective:    Patient ID: Lori Copeland, female    DOB: 09-Mar-1970, 50 y.o.   MRN: [id number]  HPI Virtual Visit via Video Note  I connected with the patient on 09/05/20 at  4:00 PM EDT by a video enabled telemedicine application and verified that I am speaking with the correct person using two identifiers.  Location patient: home Location provider:work or home office Persons participating in the virtual visit: patient, provider  I discussed the limitations of evaluation and management by telemedicine and the availability of in person appointments. The patient expressed understanding and agreed to proceed.   HPI: Here to discuss ADHD and weight gain. She had been on Lexapro for several years to treat anxiety. This caused her to gain weight, and she has put on 30 lbs. She was recently diagnosed with ADHD by an [employer], and she has been taking Vyvanse daily since December. This has been prescribed by a NP named Lori Copeland. The Lexapro was stopped and now Lori Copeland is taking a combination of Wellbutrin and Vyvanse. This has been working very well to treat her ADHD, and because of this her anxiety has almost disappeared. Her weight yesterday was 199 lbs and her BP was 110/68. She asks if we could take over treatment of her ADHD. She still meets with a psychologist monthly for therapy. She is exercising and trying to lose some weight, but this has been difficult. She asks if she can try adding Naltrexone to her regimen. 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    ROS: See pertinent positives and negatives per HPI.  Past Medical History:  Diagnosis Date   ADHD (attention deficit hyperactivity disorder), combined type 2021   Allergy    Chest pain 01/07/2010   normal work up, non cardiac     Past Surgical History:  Procedure Laterality Date   ABDOMINAL HYSTERECTOMY  1999   left ovary still intact    APPENDECTOMY      BREAST ENHANCEMENT SURGERY     DILATION AND CURETTAGE OF UTERUS  1999   ESOPHAGOGASTRODUODENOSCOPY  2010   normal    Family History  Problem Relation Age of Onset   Cancer Mother    Alcohol abuse Mother    Stomach cancer Mother    Diabetes Father    Diabetes Paternal Grandmother    Ovarian cancer Sister      Current Outpatient Medications:    buPROPion (WELLBUTRIN XL) 150 MG 24 hr tablet, TAKE 1 TABLET BY MOUTH EVERY DAY, Disp: 90 tablet, Rfl: 2   naltrexone (DEPADE) 50 MG tablet, Take 1 tablet (50 mg total) by mouth daily., Disp: 30 tablet, Rfl: 2   lisdexamfetamine (VYVANSE) 50 MG capsule, Take 1 capsule (50 mg total) by mouth daily., Disp: 30 capsule, Rfl: 0  EXAM:  VITALS per patient if applicable:  GENERAL: alert, oriented, appears well and in no acute distress  HEENT: atraumatic, conjunttiva clear, no obvious abnormalities on inspection of external nose and ears  NECK: normal movements of the head and neck  LUNGS: on inspection no signs of respiratory distress, breathing rate appears normal, no obvious gross SOB, gasping or wheezing  CV: no obvious cyanosis  MS: moves all visible extremities without noticeable abnormality  PSYCH/NEURO: pleasant and cooperative, no obvious depression or anxiety, speech and thought processing grossly intact  ASSESSMENT AND PLAN: Her anxiety has resolved. Her ADHD is well controlled, and we  agreed to take over prescribing the Vyvanse. To help with weight loss we will add Naltrexone 50 mg daily. She will follow up here in the office in one month. We spent 35 minutes reviewing records and discussing these issues.  Gershon Crane, MD  Discussed the following assessment and plan:  No diagnosis found.     I discussed the assessment and treatment plan with the patient. The patient was provided an opportunity to ask questions and all were answered. The patient agreed with the plan and demonstrated an understanding of the  instructions.   The patient was advised to call back or seek an in-person evaluation if the symptoms worsen or if the condition fails to improve as anticipated.      Review of Systems     Objective:   Physical Exam        Assessment & Plan:

## 2020-09-05 NOTE — Progress Notes (Signed)
   Subjective:    Patient ID: Lori Copeland, female    DOB: 28-Oct-1970, 50 y.o.   MRN: [id number]  HPI    Review of Systems     Objective:   Physical Exam        Assessment & Plan:

## 2020-10-03 ENCOUNTER — Other Ambulatory Visit: Payer: Self-pay | Admitting: Family Medicine

## 2020-10-04 MED ORDER — LISDEXAMFETAMINE DIMESYLATE 50 MG PO CAPS: 50.0000 mg | ORAL_CAPSULE | Freq: Every day | ORAL | 0 refills | Status: DC

## 2020-10-04 NOTE — Telephone Encounter (Signed)
Done

## 2020-10-04 NOTE — Telephone Encounter (Signed)
Last video visit- 09/05/20 Last refill- 09/05/20--30 tabs, 0 refills  No future office visit has been scheduled

## 2020-12-09 ENCOUNTER — Other Ambulatory Visit: Payer: Self-pay | Admitting: Family Medicine

## 2020-12-12 NOTE — Telephone Encounter (Signed)
Last refill-09/05/20--30 tabs, 2 refills Last VV- 09/05/20  No future office visit scheduled.    Can this patient receive a refill?

## 2021-01-18 ENCOUNTER — Other Ambulatory Visit: Payer: Self-pay | Admitting: Family Medicine

## 2021-01-19 MED ORDER — LISDEXAMFETAMINE DIMESYLATE 50 MG PO CAPS: 50.0000 mg | ORAL_CAPSULE | Freq: Every day | ORAL | 0 refills | Status: DC

## 2021-01-19 NOTE — Telephone Encounter (Signed)
Done

## 2021-01-19 NOTE — Telephone Encounter (Signed)
Last VV-09/05/20 Last refill-12/06/20-30 capsules, 0 refills  No future OV scheduled.  Can this patient receive a refill?

## 2021-02-22 ENCOUNTER — Other Ambulatory Visit: Payer: Self-pay | Admitting: Family Medicine

## 2021-02-22 DIAGNOSIS — F419 Anxiety disorder, unspecified: Secondary | ICD-10-CM

## 2021-03-19 ENCOUNTER — Telehealth: Payer: Self-pay | Admitting: Family Medicine

## 2021-03-19 NOTE — Telephone Encounter (Signed)
Patient requested an appt through MyChart for Annual Physical, blood work, and assessment of nodule on finger. I ATC pt to schedule 2 separate appts. ? ? ?

## 2021-03-20 ENCOUNTER — Ambulatory Visit: Payer: BC Managed Care – PPO | Admitting: Family Medicine

## 2021-03-22 ENCOUNTER — Encounter: Payer: Self-pay | Admitting: Family Medicine

## 2021-03-22 ENCOUNTER — Ambulatory Visit (INDEPENDENT_AMBULATORY_CARE_PROVIDER_SITE_OTHER): Payer: BC Managed Care – PPO | Admitting: Family Medicine

## 2021-03-22 VITALS — BP 110/78 | HR 84 | Temp 98.7°F | Wt 198.0 lb

## 2021-03-22 DIAGNOSIS — M67442 Ganglion, left hand: Secondary | ICD-10-CM | POA: Diagnosis not present

## 2021-03-22 NOTE — Patient Instructions (Signed)
Health Maintenance Due  ?Topic Date Due  ? COVID-19 Vaccine (1) Never done  ? HIV Screening  Never done  ? Hepatitis C Screening  Never done  ? PAP SMEAR-Modifier  Never done  ? COLONOSCOPY (Pts 45-45yrs Insurance coverage will need to be confirmed)  Never done  ? ? ?Depression screen [facility] 2/9 07/21/2018  ?Decreased Interest 0  ?Down, Depressed, Hopeless 0  ?PHQ - 2 Score 0  ? ? ?

## 2021-03-22 NOTE — Progress Notes (Signed)
? ?  Subjective:  ? ? Patient ID: Lori Copeland, female    DOB: 1970-11-04, 51 y.o.   MRN: [id number] ? ?HPI ?Here for a painful lump on the left 4th finger that came up 5 months ago. No hx of trauma. 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  ? ? ?Review of Systems  ?Constitutional: Negative.   ?Respiratory: Negative.    ?Cardiovascular: Negative.   ?Musculoskeletal:  Positive for arthralgias.  ? ?   ?Objective:  ? Physical Exam ?Constitutional:   ?   Appearance: Normal appearance.  ?Cardiovascular:  ?   Rate and Rhythm: Normal rate and regular rhythm.  ?   Pulses: Normal pulses.  ?   Heart sounds: Normal heart sounds.  ?Pulmonary:  ?   Effort: Pulmonary effort is normal.  ?   Breath sounds: Normal breath sounds.  ?Musculoskeletal:  ?   Comments: The left 4th finger has a small firm round tender cyst over the DIP   ?Neurological:  ?   Mental Status: She is alert.  ? ? ? ? ? ?   ?Assessment & Plan:  ?Ganglion cyst. We will refer her to Hand Surgery to treat.  ?Alysia Penna, MD ? ? ?

## 2021-04-06 ENCOUNTER — Encounter: Payer: Self-pay | Admitting: Orthopedic Surgery

## 2021-04-06 ENCOUNTER — Ambulatory Visit (INDEPENDENT_AMBULATORY_CARE_PROVIDER_SITE_OTHER): Payer: BC Managed Care – PPO | Admitting: Orthopedic Surgery

## 2021-04-06 ENCOUNTER — Ambulatory Visit (INDEPENDENT_AMBULATORY_CARE_PROVIDER_SITE_OTHER): Payer: BC Managed Care – PPO

## 2021-04-06 DIAGNOSIS — R2232 Localized swelling, mass and lump, left upper limb: Secondary | ICD-10-CM | POA: Diagnosis not present

## 2021-04-06 NOTE — Progress Notes (Signed)
? ?Office Visit Note ?  ?Patient: Lori Copeland           ?Date of Birth: 1970/10/23           ?MRN: [id number] ?Visit Date: 04/06/2021 ?             ?Requested by: Nelwyn Salisbury, MD ?[phone] ?[place],  [place] [address] ?PCP: Nelwyn Salisbury, MD ? ? ?Assessment & Plan: ?Visit Diagnoses:  ?1. Finger mass, left   ? ? ?Plan: Discussed with patient that the subtle nodule at the radial aspect of the dorsal ring finger DIP joint may be an early Heberden's node versus a small mucous cyst.  The mass is asymptomatic today.  We reviewed her x-rays which show well-maintained joint space with minimal degenerative changes.  At this point, she will continue to monitor her symptoms and will return if things worsen. ? ?Follow-Up Instructions: No follow-ups on file.  ? ?Orders:  ?Orders Placed This Encounter  ?Procedures  ? XR Finger Ring Left  ? ?No orders of the defined types were placed in this encounter. ? ? ? ? Procedures: ?No procedures performed ? ? ?Clinical Data: ?No additional findings. ? ? ?Subjective: ?Chief Complaint  ?Patient presents with  ? Left Ring Finger - Cyst  ? ? 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  She thinks this was first noticed several months ago.  She did note that she had similar findings on her fingers but these resolved.  The area of the ring finger was previously sensitive and occasionally painful but is now asymptomatic.  She has no pain with range of motion of the DIP joint.  She has no changes to her nail plate. ? ? ?Review of Systems ? ? ?Objective: ?Vital Signs: There were no vitals taken for this visit. ? ?Physical Exam ?Constitutional:   ?   Appearance: Normal appearance.  ?Cardiovascular:  ?   Rate and Rhythm: Normal rate.  ?   Pulses: Normal pulses.  ?Pulmonary:  ?   Effort: Pulmonary effort is normal.  ?Skin: ?   General: Skin is warm and dry.  ?   Capillary Refill:  Capillary refill takes less than 2 seconds.  ?Neurological:  ?   Mental Status: She is alert.  ? ? ?Left Hand Exam  ? ?Tenderness  ?The patient is experiencing no tenderness.  ? ?Other  ?Erythema: absent ?Sensation: normal ?Pulse: present ? ?Comments:  Subtle swelling at dorsal radial aspect of ring finger around DIP joint.  Does not appear to be fluid filled like a mucous cyst.  No changes to nail.  No pain w/ ROM of DIP joint.  ? ? ? ? ?Specialty Comments:  ?No specialty comments available. ? ?Imaging: ?No results found. ? ? ?PMFS History: ?Patient Active Problem List  ? Diagnosis Date Noted  ? ADHD (attention deficit hyperactivity disorder), combined type 05/26/2020  ? Anxiety disorder 10/01/2018  ? ?Past Medical History:  ?Diagnosis Date  ? ADHD (attention deficit hyperactivity disorder), combined type 2021  ? Allergy   ? Chest pain 01/07/2010  ? normal work up, non cardiac   ?  ?Family History  ?Problem Relation Age of Onset  ? Cancer Mother   ? Alcohol abuse Mother   ? Stomach cancer Mother   ? Diabetes Father   ? Diabetes Paternal Grandmother   ? Ovarian cancer Sister   ?  ?  Past Surgical History:  ?Procedure Laterality Date  ? ABDOMINAL HYSTERECTOMY  1999  ? left ovary still intact   ? APPENDECTOMY    ? BREAST ENHANCEMENT SURGERY    ? DILATION AND CURETTAGE OF UTERUS  1999  ? ESOPHAGOGASTRODUODENOSCOPY  2010  ? normal  ? ?Social History  ? ?Occupational History  ? Occupation: PHLEBOTOMIST  ?  Employer: [employer]  ?Tobacco Use  ? Smoking status: Former  ?  Types: Cigarettes  ?  Quit date: 01/08/1991  ?  Years since quitting: 30.2  ? Smokeless tobacco: Never  ?Substance and Sexual Activity  ? Alcohol use: Yes  ? Drug use: No  ? Sexual activity: Not on file  ? ? ? ? ? ? ?

## 2021-05-09 ENCOUNTER — Other Ambulatory Visit: Payer: Self-pay | Admitting: Family Medicine

## 2021-05-09 DIAGNOSIS — F419 Anxiety disorder, unspecified: Secondary | ICD-10-CM

## 2021-05-09 MED ORDER — LISDEXAMFETAMINE DIMESYLATE 50 MG PO CAPS: 50.0000 mg | ORAL_CAPSULE | Freq: Every day | ORAL | 0 refills | Status: DC

## 2021-05-09 NOTE — Telephone Encounter (Signed)
Last OV- 03/22/21 ?Last refill- 03/19/21--30 capsules, 0 refills ? ?No future OV scheduled ? ?

## 2021-05-09 NOTE — Telephone Encounter (Signed)
Done

## 2021-05-16 ENCOUNTER — Encounter: Payer: Self-pay | Admitting: Family Medicine

## 2021-05-16 ENCOUNTER — Ambulatory Visit (INDEPENDENT_AMBULATORY_CARE_PROVIDER_SITE_OTHER): Payer: BC Managed Care – PPO | Admitting: Family Medicine

## 2021-05-16 VITALS — BP 120/80 | HR 81 | Temp 97.9°F | Ht 71.0 in | Wt 198.0 lb

## 2021-05-16 DIAGNOSIS — N6312 Unspecified lump in the right breast, upper inner quadrant: Secondary | ICD-10-CM | POA: Diagnosis not present

## 2021-05-16 NOTE — Progress Notes (Signed)
? ?  Subjective:  ? ? Patient ID: Lori Copeland, female    DOB: 1970/05/26, 51 y.o.   MRN: [id number] ? ?HPI ?Here to check a lump in the right breast that she noticed 3 days ago. It is mildly tender. She feels fine otherwise. She has never had a mammogram. ? ? ?Review of Systems  ?Constitutional: Negative.   ?Respiratory: Negative.    ?Cardiovascular: Negative.   ? ?   ?Objective:  ? Physical Exam ?Constitutional:   ?   Appearance: Normal appearance.  ?Cardiovascular:  ?   Rate and Rhythm: Normal rate and regular rhythm.  ?   Pulses: Normal pulses.  ?   Heart sounds: Normal heart sounds.  ?Pulmonary:  ?   Effort: Pulmonary effort is normal.  ?   Breath sounds: Normal breath sounds.  ?   Comments: There is a well defined round mobile tender lump in the right breast at the 1 o'clock position close to the sternum.  ?Neurological:  ?   Mental Status: She is alert.  ? ? ? ? ? ?   ?Assessment & Plan:  ?Right breast lump. Set up a bilateral diagnostic mammogram and right breast US.  ?Gershon Crane, MD ? ? ?

## 2021-06-12 ENCOUNTER — Ambulatory Visit
Admission: RE | Admit: 2021-06-12 | Discharge: 2021-06-12 | Disposition: A | Discharge status: Home / Self Care | Payer: BC Managed Care – PPO | Source: Ambulatory Visit | Attending: Family Medicine | Admitting: Family Medicine

## 2021-06-12 DIAGNOSIS — N6312 Unspecified lump in the right breast, upper inner quadrant: Secondary | ICD-10-CM

## 2021-06-12 DIAGNOSIS — R928 Other abnormal and inconclusive findings on diagnostic imaging of breast: Secondary | ICD-10-CM | POA: Diagnosis not present

## 2021-06-12 DIAGNOSIS — N6489 Other specified disorders of breast: Secondary | ICD-10-CM | POA: Diagnosis not present

## 2021-08-19 ENCOUNTER — Other Ambulatory Visit: Payer: Self-pay | Admitting: Family Medicine

## 2021-08-20 NOTE — Telephone Encounter (Signed)
Pt LOV was in 05/16/2021 Last refill done on 07/09/2021 Please advise

## 2021-08-21 MED ORDER — LISDEXAMFETAMINE DIMESYLATE 50 MG PO CAPS: 50.0000 mg | ORAL_CAPSULE | Freq: Every day | ORAL | 0 refills | Status: DC

## 2021-08-21 NOTE — Telephone Encounter (Signed)
Done

## 2021-10-10 ENCOUNTER — Encounter: Payer: Self-pay | Admitting: Family Medicine

## 2021-10-10 ENCOUNTER — Ambulatory Visit (INDEPENDENT_AMBULATORY_CARE_PROVIDER_SITE_OTHER): Payer: BC Managed Care – PPO | Admitting: Family Medicine

## 2021-10-10 VITALS — BP 120/80 | HR 77 | Temp 98.7°F | Wt 199.0 lb

## 2021-10-10 DIAGNOSIS — Z1341 Encounter for autism screening: Secondary | ICD-10-CM | POA: Insufficient documentation

## 2021-10-10 DIAGNOSIS — F902 Attention-deficit hyperactivity disorder, combined type: Secondary | ICD-10-CM

## 2021-10-10 DIAGNOSIS — F419 Anxiety disorder, unspecified: Secondary | ICD-10-CM | POA: Diagnosis not present

## 2021-10-10 MED ORDER — AMPHETAMINE-DEXTROAMPHET ER 20 MG PO CP24: 20.0000 mg | ORAL_CAPSULE | ORAL | 0 refills | Status: DC

## 2021-10-10 NOTE — Progress Notes (Signed)
   Subjective:    Patient ID: Lori Copeland, female    DOB: 1970/08/26, 51 y.o.   MRN: [id number]  HPI Here to discuss several psychiatric issues. She was diagnosed with ADHD several years ago, and she has taken Vyvanse since then . This worked well at first, but over the past 6 months it has been less effective. She finds her mind to be flipping from topic to topic, and this prevents her from getting things accomplished. She is interested in trying something else. Also she asks me if thought she could be on the autism spectrum. She has been researching this and thinking about her life, and she thinks she may have a mild form of it.  She has always had trouble communicating with other people, and she has trouble expressing herself. She often got into trouble in school because her behavior was different from everyone else. As she has gotten older, she has found ways to overcome some of these things. Her anxiety has been stable and she is pleased with the Wellbutrin.  Review of Systems  Constitutional: Negative.   Respiratory: Negative.    Cardiovascular: Negative.   Neurological: Negative.   Psychiatric/Behavioral:  Positive for decreased concentration and dysphoric mood. Negative for agitation, behavioral problems, confusion, hallucinations, self-injury, sleep disturbance and suicidal ideas. The patient is nervous/anxious. The patient is not hyperactive.        Objective:   Physical Exam Constitutional:      Appearance: Normal appearance.  Cardiovascular:     Rate and Rhythm: Normal rate and regular rhythm.     Pulses: Normal pulses.     Heart sounds: Normal heart sounds.  Pulmonary:     Effort: Pulmonary effort is normal.     Breath sounds: Normal breath sounds.  Neurological:     General: No focal deficit present.     Mental Status: She is alert and oriented to person, place, and time.  Psychiatric:        Mood and Affect: Mood normal.        Behavior: Behavior normal.         Thought Content: Thought content normal.           Assessment & Plan:  Her anxiety is stable so she will stay on Wellbutrin. Her ADHD is not well controlled, so she will stop the Vyvanse and try Adderall XR 20 mg every morning. Finally I gave her contact information for the [facility]. She will meet with them for testing to see if she could have a form of autism. We spent a total of (32   ) minutes reviewing records and discussing these issues.  Alysia Penna, MD

## 2021-10-11 NOTE — Telephone Encounter (Signed)
The form is ready  

## 2021-10-15 ENCOUNTER — Other Ambulatory Visit: Payer: Self-pay | Admitting: Family Medicine

## 2021-10-15 DIAGNOSIS — F419 Anxiety disorder, unspecified: Secondary | ICD-10-CM

## 2021-10-24 ENCOUNTER — Encounter: Payer: Self-pay | Admitting: Family Medicine

## 2021-10-29 NOTE — Telephone Encounter (Signed)
Pt form was completed and faxed to [place], copy of the form sent to scanning

## 2021-10-31 ENCOUNTER — Encounter: Payer: BC Managed Care – PPO | Admitting: Family Medicine

## 2021-11-09 ENCOUNTER — Encounter: Payer: Self-pay | Admitting: Family Medicine

## 2021-11-09 ENCOUNTER — Ambulatory Visit (INDEPENDENT_AMBULATORY_CARE_PROVIDER_SITE_OTHER): Payer: BC Managed Care – PPO | Admitting: Family Medicine

## 2021-11-09 VITALS — BP 128/80 | HR 89 | Temp 98.0°F

## 2021-11-09 DIAGNOSIS — F902 Attention-deficit hyperactivity disorder, combined type: Secondary | ICD-10-CM

## 2021-11-09 MED ORDER — AMPHETAMINE-DEXTROAMPHET ER 30 MG PO CP24: 30.0000 mg | ORAL_CAPSULE | ORAL | 0 refills | Status: DC

## 2021-11-09 NOTE — Progress Notes (Signed)
   Subjective:    Patient ID: Lori Copeland, female    DOB: September 22, 1970, 51 y.o.   MRN: [id number]  HPI Here to follow up on ADHD. She has better focus now and no side effects to report. She has tried the 10 mg and 20 mg doses, and she now wants to try 30 mg.    Review of Systems  Constitutional: Negative.   Respiratory: Negative.    Cardiovascular: Negative.   Neurological: Negative.        Objective:   Physical Exam Constitutional:      Appearance: Normal appearance.  Cardiovascular:     Rate and Rhythm: Normal rate and regular rhythm.     Pulses: Normal pulses.     Heart sounds: Normal heart sounds.  Pulmonary:     Effort: Pulmonary effort is normal.     Breath sounds: Normal breath sounds.  Neurological:     Mental Status: She is alert.           Assessment & Plan:  ADHD, she will try Adderall XR 30 mg daily. Report back in a few weeks. Alysia Penna, MD

## 2021-12-19 ENCOUNTER — Other Ambulatory Visit: Payer: Self-pay | Admitting: Family Medicine

## 2021-12-20 MED ORDER — AMPHETAMINE-DEXTROAMPHET ER 30 MG PO CP24: 30.0000 mg | ORAL_CAPSULE | ORAL | 0 refills | Status: DC

## 2021-12-20 NOTE — Telephone Encounter (Signed)
Done

## 2021-12-20 NOTE — Telephone Encounter (Signed)
Pt LOV was on 11/29/21 Last refill was done on 11/29/21 Please advise

## 2022-01-01 ENCOUNTER — Encounter: Payer: Self-pay | Admitting: Family Medicine

## 2022-01-02 NOTE — Telephone Encounter (Signed)
Noted. She should quarantine at home for 5 days

## 2022-01-02 NOTE — Telephone Encounter (Signed)
What are her symptoms? Is she seeing someone about this?

## 2022-01-18 ENCOUNTER — Other Ambulatory Visit: Payer: Self-pay | Admitting: Family Medicine

## 2022-01-18 DIAGNOSIS — F419 Anxiety disorder, unspecified: Secondary | ICD-10-CM

## 2022-01-29 ENCOUNTER — Encounter: Payer: Self-pay | Admitting: Family Medicine

## 2022-01-30 MED ORDER — AMPHETAMINE-DEXTROAMPHET ER 30 MG PO CP24: 30.0000 mg | ORAL_CAPSULE | ORAL | 0 refills | Status: DC

## 2022-01-30 NOTE — Telephone Encounter (Signed)
The other date refers to the last refill at her old pharmacy, which has not been picked up. I have sent in 3 months of refills dated today to her new pharmacy

## 2022-01-30 NOTE — Addendum Note (Signed)
Addended by: Alysia Penna A on: 01/30/2022 10:41 AM   Modules accepted: Orders

## 2022-02-19 ENCOUNTER — Other Ambulatory Visit: Payer: Self-pay | Admitting: Family Medicine

## 2022-02-19 ENCOUNTER — Other Ambulatory Visit: Payer: Self-pay

## 2022-02-19 DIAGNOSIS — F419 Anxiety disorder, unspecified: Secondary | ICD-10-CM

## 2022-02-19 MED ORDER — BUPROPION HCL ER (XL) 150 MG PO TB24: 150.0000 mg | ORAL_TABLET | Freq: Every day | ORAL | 0 refills | Status: DC

## 2022-05-13 ENCOUNTER — Other Ambulatory Visit: Payer: Self-pay | Admitting: Family Medicine

## 2022-05-13 DIAGNOSIS — F419 Anxiety disorder, unspecified: Secondary | ICD-10-CM

## 2022-05-13 MED ORDER — BUPROPION HCL ER (XL) 150 MG PO TB24: 150.0000 mg | ORAL_TABLET | Freq: Every day | ORAL | 0 refills | Status: DC

## 2022-05-22 ENCOUNTER — Other Ambulatory Visit: Payer: Self-pay | Admitting: Family Medicine

## 2022-05-23 MED ORDER — ADDERALL XR 30 MG PO CP24: 30.0000 mg | ORAL_CAPSULE | Freq: Every day | ORAL | 0 refills | Status: DC

## 2022-05-23 NOTE — Telephone Encounter (Signed)
I sent in the name brand

## 2022-07-30 ENCOUNTER — Other Ambulatory Visit: Payer: Self-pay | Admitting: Family Medicine

## 2022-07-30 DIAGNOSIS — F419 Anxiety disorder, unspecified: Secondary | ICD-10-CM

## 2022-07-30 MED ORDER — BUPROPION HCL ER (XL) 150 MG PO TB24: 150.0000 mg | ORAL_TABLET | Freq: Every day | ORAL | 3 refills | Status: DC

## 2022-07-30 MED ORDER — ADDERALL XR 30 MG PO CP24: 30.0000 mg | ORAL_CAPSULE | Freq: Every day | ORAL | 0 refills | Status: DC

## 2022-07-30 NOTE — Telephone Encounter (Signed)
Done

## 2022-10-13 ENCOUNTER — Other Ambulatory Visit: Payer: Self-pay | Admitting: Family Medicine

## 2022-10-15 MED ORDER — ADDERALL XR 30 MG PO CP24: 30.0000 mg | ORAL_CAPSULE | Freq: Every day | ORAL | 0 refills | Status: DC

## 2022-10-15 NOTE — Telephone Encounter (Signed)
Done

## 2022-10-15 NOTE — Telephone Encounter (Signed)
Pt LOV was on 11/09/21 Last refill was done on 07/30/22 Please advise

## 2022-10-21 ENCOUNTER — Encounter: Payer: BC Managed Care – PPO | Admitting: Family Medicine

## 2023-01-02 ENCOUNTER — Encounter: Payer: Self-pay | Admitting: Family Medicine

## 2023-01-02 ENCOUNTER — Ambulatory Visit (INDEPENDENT_AMBULATORY_CARE_PROVIDER_SITE_OTHER): Payer: PRIVATE HEALTH INSURANCE | Admitting: Family Medicine

## 2023-01-02 VITALS — BP 118/80 | HR 59 | Temp 98.0°F | Ht 70.5 in | Wt 199.0 lb

## 2023-01-02 DIAGNOSIS — Z0000 Encounter for general adult medical examination without abnormal findings: Secondary | ICD-10-CM

## 2023-01-02 DIAGNOSIS — E559 Vitamin D deficiency, unspecified: Secondary | ICD-10-CM

## 2023-01-02 LAB — CBC WITH DIFFERENTIAL/PLATELET
Basophils Absolute: 0 10*3/uL (ref 0.0–0.1)
Basophils Relative: 0.6 % (ref 0.0–3.0)
Eosinophils Absolute: 0.1 10*3/uL (ref 0.0–0.7)
Eosinophils Relative: 2.3 % (ref 0.0–5.0)
HCT: 39.9 % (ref 36.0–46.0)
Hemoglobin: 13.4 g/dL (ref 12.0–15.0)
Lymphocytes Relative: 47.3 % — ABNORMAL HIGH (ref 12.0–46.0)
Lymphs Abs: 2.1 10*3/uL (ref 0.7–4.0)
MCHC: 33.6 g/dL (ref 30.0–36.0)
MCV: 100 fL (ref 78.0–100.0)
Monocytes Absolute: 0.2 10*3/uL (ref 0.1–1.0)
Monocytes Relative: 4.4 % (ref 3.0–12.0)
Neutro Abs: 2 10*3/uL (ref 1.4–7.7)
Neutrophils Relative %: 45.4 % (ref 43.0–77.0)
Platelets: 282 10*3/uL (ref 150.0–400.0)
RBC: 3.99 Mil/uL (ref 3.87–5.11)
RDW: 12.6 % (ref 11.5–15.5)
WBC: 4.5 10*3/uL (ref 4.0–10.5)

## 2023-01-02 LAB — BASIC METABOLIC PANEL
BUN: 16 mg/dL (ref 6–23)
CO2: 29 meq/L (ref 19–32)
Calcium: 9.7 mg/dL (ref 8.4–10.5)
Chloride: 103 meq/L (ref 96–112)
Creatinine, Ser: 0.86 mg/dL (ref 0.40–1.20)
GFR: 78.71 mL/min (ref >60.00)
Glucose, Bld: 97 mg/dL (ref 70–99)
Potassium: 4 meq/L (ref 3.5–5.1)
Sodium: 139 meq/L (ref 135–145)

## 2023-01-02 LAB — HEPATIC FUNCTION PANEL
ALT: 18 U/L (ref 0–35)
AST: 18 U/L (ref 0–37)
Albumin: 4.4 g/dL (ref 3.5–5.2)
Alkaline Phosphatase: 107 U/L (ref 39–117)
Bilirubin, Direct: 0.1 mg/dL (ref 0.0–0.3)
Total Bilirubin: 0.5 mg/dL (ref 0.2–1.2)
Total Protein: 7 g/dL (ref 6.0–8.3)

## 2023-01-02 LAB — HEMOGLOBIN A1C: Hgb A1c MFr Bld: 5.5 % (ref 4.6–6.5)

## 2023-01-02 LAB — LIPID PANEL
Cholesterol: 174 mg/dL (ref 0–200)
HDL: 46.8 mg/dL (ref >39.00)
LDL Cholesterol: 106 mg/dL — ABNORMAL HIGH (ref 0–99)
NonHDL: 127.34
Total CHOL/HDL Ratio: 4
Triglycerides: 109 mg/dL (ref 0.0–149.0)
VLDL: 21.8 mg/dL (ref 0.0–40.0)

## 2023-01-02 LAB — VITAMIN D 25 HYDROXY (VIT D DEFICIENCY, FRACTURES): VITD: 64.88 ng/mL (ref 30.00–100.00)

## 2023-01-02 LAB — TSH: TSH: 2.34 u[IU]/mL (ref 0.35–5.50)

## 2023-01-02 MED ORDER — ADDERALL XR 30 MG PO CP24: 30.0000 mg | ORAL_CAPSULE | Freq: Every day | ORAL | 0 refills | Status: DC

## 2023-01-02 NOTE — Progress Notes (Signed)
   Subjective:    Patient ID: Lori Copeland, female    DOB: 01-04-1971, 52 y.o.   MRN: [id number]  HPI Here for a well exam. She feels fine.    Review of Systems  Constitutional: Negative.   HENT: Negative.    Eyes: Negative.   Respiratory: Negative.    Cardiovascular: Negative.   Gastrointestinal: Negative.   Genitourinary:  Negative for decreased urine volume, difficulty urinating, dyspareunia, dysuria, enuresis, flank pain, frequency, hematuria, pelvic pain and urgency.  Musculoskeletal: Negative.   Skin: Negative.   Neurological: Negative.  Negative for headaches.  Psychiatric/Behavioral: Negative.         Objective:   Physical Exam Constitutional:      General: She is not in acute distress.    Appearance: Normal appearance. She is well-developed.  HENT:     Head: Normocephalic and atraumatic.     Right Ear: External ear normal.     Left Ear: External ear normal.     Nose: Nose normal.     Mouth/Throat:     Pharynx: No oropharyngeal exudate.  Eyes:     General: No scleral icterus.    Conjunctiva/sclera: Conjunctivae normal.     Pupils: Pupils are equal, round, and reactive to light.  Neck:     Thyroid: No thyromegaly.     Vascular: No JVD.  Cardiovascular:     Rate and Rhythm: Normal rate and regular rhythm.     Pulses: Normal pulses.     Heart sounds: Normal heart sounds. No murmur heard.    No friction rub. No gallop.  Pulmonary:     Effort: Pulmonary effort is normal. No respiratory distress.     Breath sounds: Normal breath sounds. No wheezing or rales.  Chest:     Chest wall: No tenderness.  Abdominal:     General: Bowel sounds are normal. There is no distension.     Palpations: Abdomen is soft. There is no mass.     Tenderness: There is no abdominal tenderness. There is no guarding or rebound.  Musculoskeletal:        General: No tenderness. Normal range of motion.     Cervical back: Normal range of motion and neck supple.  Lymphadenopathy:      Cervical: No cervical adenopathy.  Skin:    General: Skin is warm and dry.     Findings: No erythema or rash.  Neurological:     General: No focal deficit present.     Mental Status: She is alert and oriented to person, place, and time.     Cranial Nerves: No cranial nerve deficit.     Motor: No abnormal muscle tone.     Coordination: Coordination normal.     Deep Tendon Reflexes: Reflexes are normal and symmetric. Reflexes normal.  Psychiatric:        Mood and Affect: Mood normal.        Behavior: Behavior normal.        Thought Content: Thought content normal.        Judgment: Judgment normal.           Assessment & Plan:  Well exam. We discussed diet and exercise. Get fasting labs. Gershon Crane, MD

## 2023-02-03 ENCOUNTER — Encounter: Payer: Self-pay | Admitting: Family Medicine

## 2023-02-03 DIAGNOSIS — Z1211 Encounter for screening for malignant neoplasm of colon: Secondary | ICD-10-CM

## 2023-02-05 NOTE — Telephone Encounter (Signed)
Done

## 2023-02-07 ENCOUNTER — Encounter: Payer: Self-pay | Admitting: Gastroenterology

## 2023-02-13 ENCOUNTER — Other Ambulatory Visit: Payer: Self-pay | Admitting: Family Medicine

## 2023-02-14 ENCOUNTER — Encounter: Payer: Self-pay | Admitting: Family Medicine

## 2023-03-11 IMAGING — MG MM  DIGITAL DIAGNOSTIC BREAST BILAT IMPLANT W/ TOMO W/ CAD
8 of 12 series · 8 of 28 positions shown · non-contrast
Comparison: None available.

CLINICAL DATA: Patient describes a recent palpable lump in the far
upper inner RIGHT breast (parasternal), but patient does not
confidently feel the lump today. This is patient's first mammogram.

EXAM:
DIGITAL DIAGNOSTIC BILATERAL MAMMOGRAM WITH IMPLANTS, CAD AND
TOMOSYNTHESIS; ULTRASOUND RIGHT BREAST LIMITED
TECHNIQUE: Bilateral digital diagnostic mammography and breast tomosynthesis
was performed. The images were evaluated with computer-aided
detection. Standard and/or implant displaced views were performed.;
Targeted ultrasound examination of the right breast was performed

[R CC]
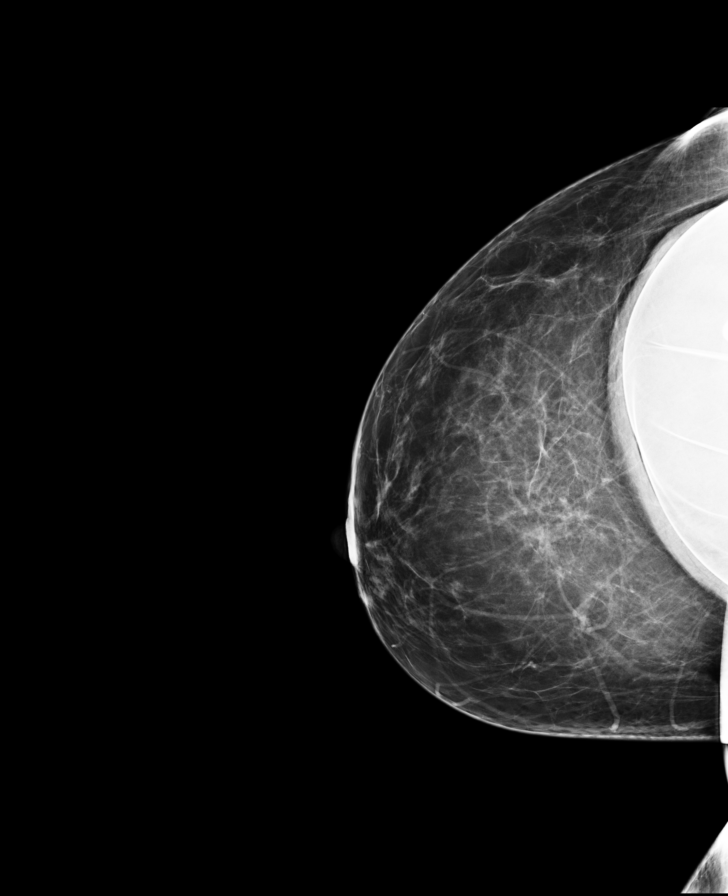

[L MLO]
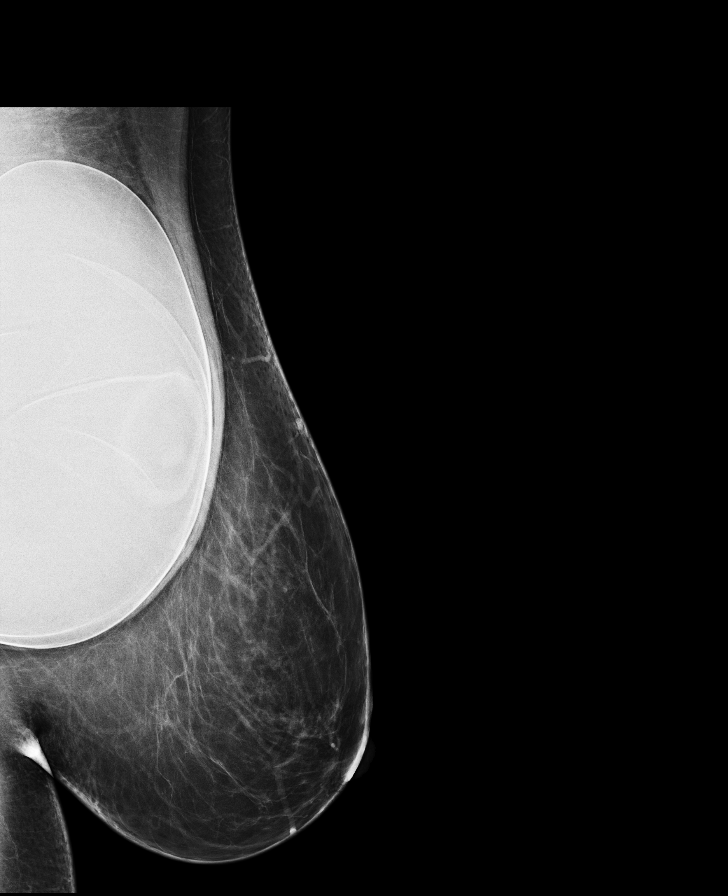

[R MLO]
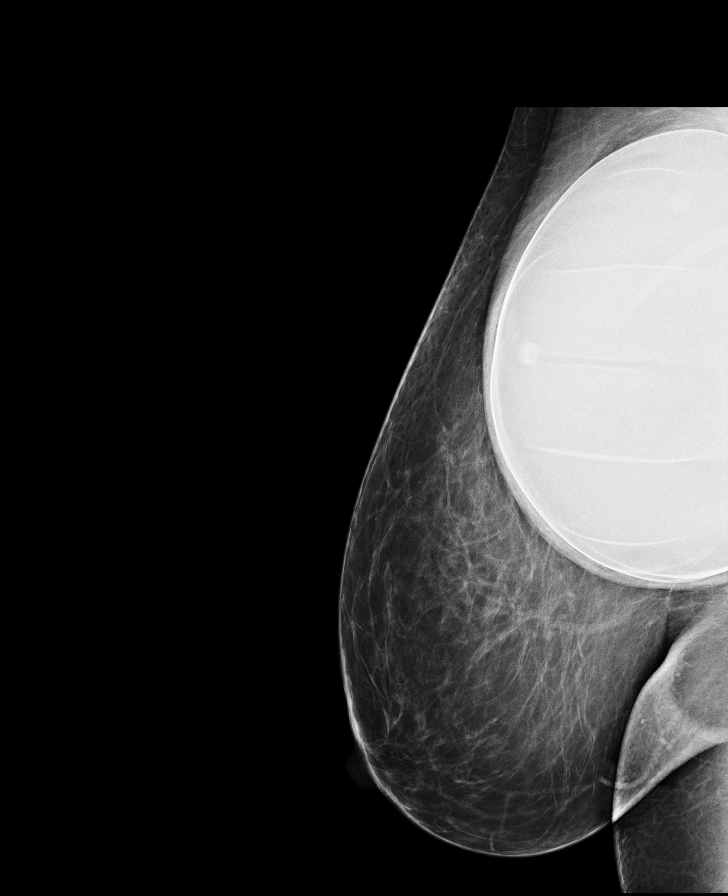

[L CC]
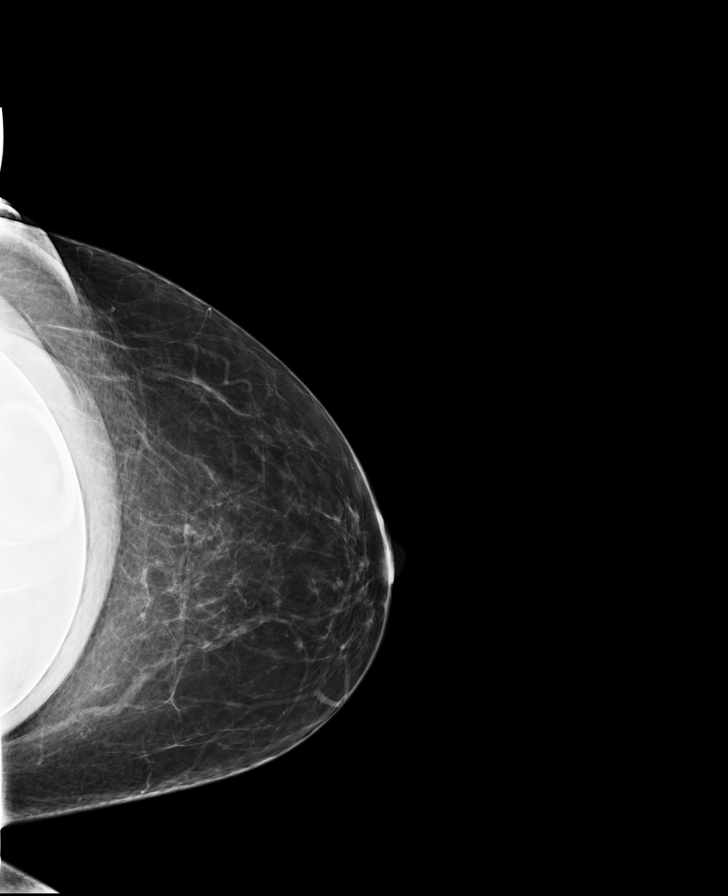

[R CC synth-2D]
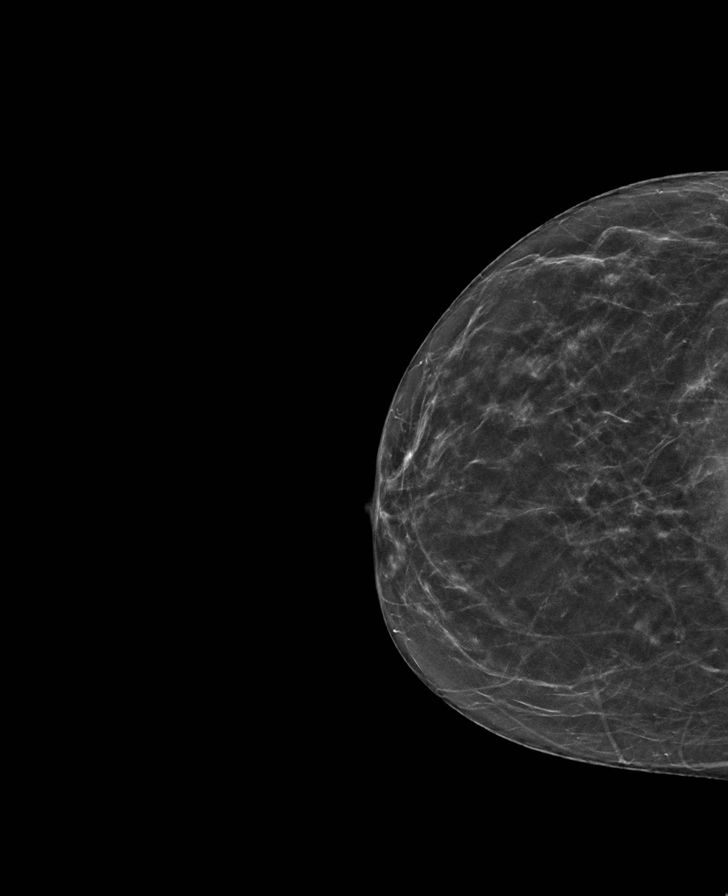

[L CC synth-2D]
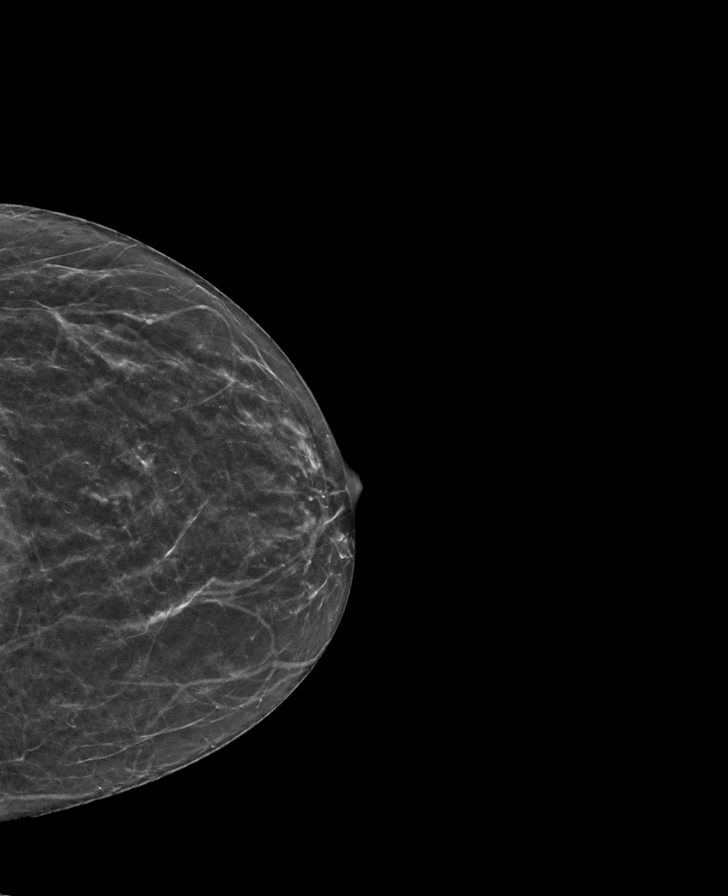

[R MLO synth-2D]
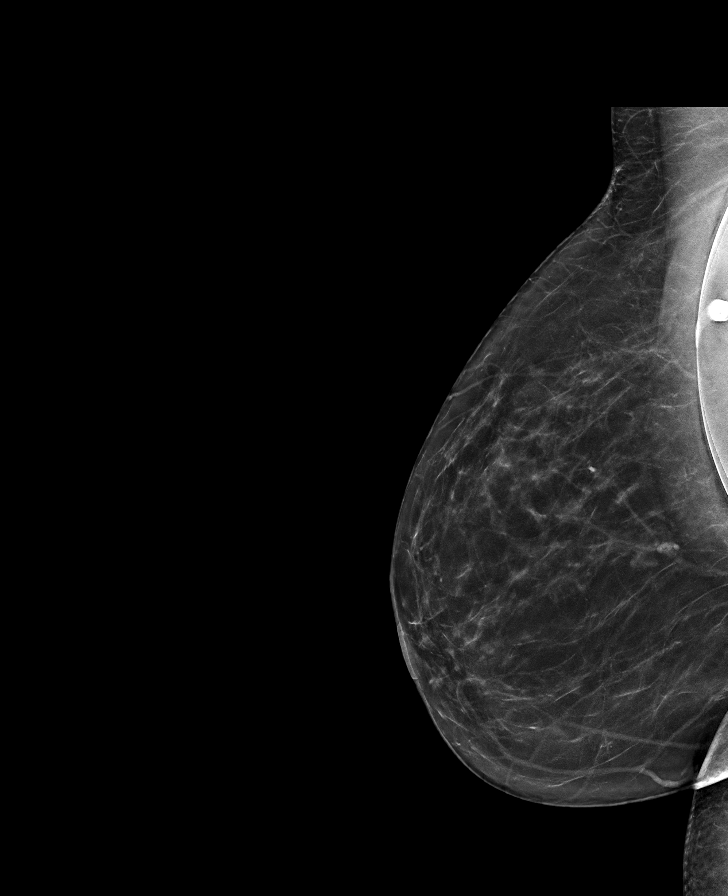

[L MLO synth-2D]
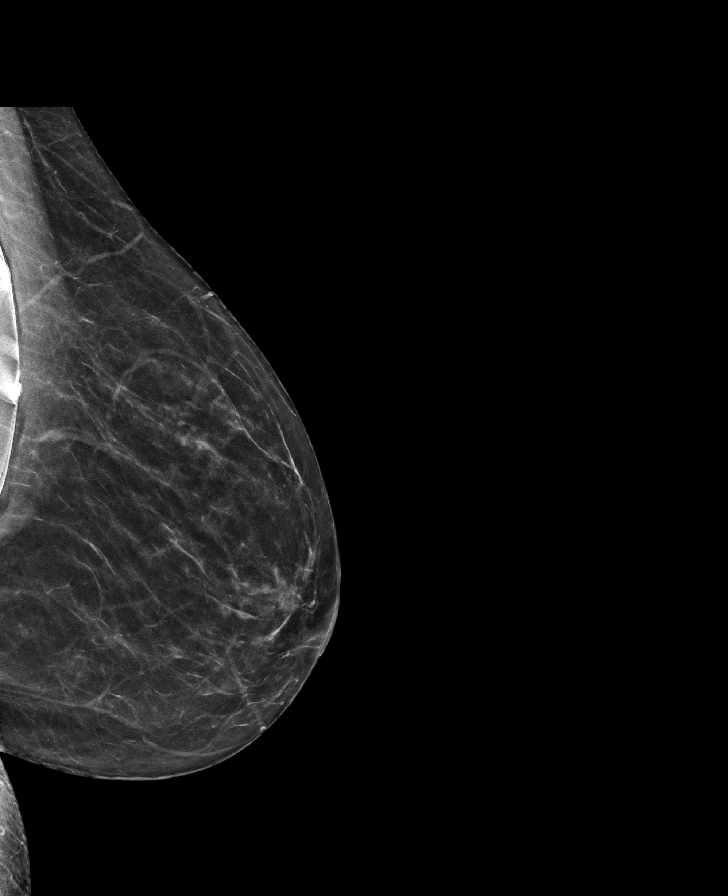

[8 of 28 positions shown; findings below may reference images not displayed]

ACR Breast Density Category b: There are scattered areas of
fibroglandular density.
FINDINGS: There are no dominant masses, suspicious calcifications or secondary
signs of malignancy identified within either breast. Specifically,
there is no mammographic abnormality identified within the upper
inner RIGHT breast corresponding to the area of clinical concern.
Ultrasound will be performed to ensure benignity.

The patient has retropectoral implants.

On physical exam, I feel no fixed or circumscribed mass within the
upper inner quadrant of the RIGHT breast.

Targeted ultrasound is performed, evaluating the upper inner
quadrant of the RIGHT breast with particular attention to the 1
o'clock axis as directed by the patient, showing only normal
fibroglandular tissues and fat lobules throughout. No solid or
cystic mass.
IMPRESSION: No evidence of malignancy within either breast. Specifically, no
evidence of malignancy within the upper inner quadrant of the RIGHT
breast corresponding to patient's area of clinical concern.

RECOMMENDATION:
1.  Screening mammogram in one year.(Code:HM-A-HQU)
2. The patient was instructed to return sooner if the area that she
feels becomes larger and/or firmer to palpation, or if a new
palpable abnormality is identified in either breast.

I have discussed the findings and recommendations with the patient.
If applicable, a reminder letter will be sent to the patient
regarding the next appointment.

BI-RADS CATEGORY  1: Negative.

## 2023-03-20 ENCOUNTER — Encounter: Payer: PRIVATE HEALTH INSURANCE | Admitting: Gastroenterology

## 2023-06-11 ENCOUNTER — Other Ambulatory Visit: Payer: Self-pay | Admitting: Family Medicine

## 2023-06-12 NOTE — Telephone Encounter (Signed)
 Done

## 2023-09-02 ENCOUNTER — Encounter: Payer: Self-pay | Admitting: Family Medicine

## 2023-09-02 ENCOUNTER — Telehealth: Payer: PRIVATE HEALTH INSURANCE | Admitting: Family Medicine

## 2023-09-02 DIAGNOSIS — F419 Anxiety disorder, unspecified: Secondary | ICD-10-CM | POA: Diagnosis not present

## 2023-09-02 DIAGNOSIS — F902 Attention-deficit hyperactivity disorder, combined type: Secondary | ICD-10-CM

## 2023-09-02 MED ORDER — ADDERALL XR 30 MG PO CP24: 30.0000 mg | ORAL_CAPSULE | Freq: Every day | ORAL | 0 refills | Status: DC

## 2023-09-02 MED ORDER — BUPROPION HCL ER (XL) 150 MG PO TB24: 150.0000 mg | ORAL_TABLET | Freq: Every day | ORAL | 3 refills | Status: DC

## 2023-09-02 NOTE — Progress Notes (Signed)
   Subjective:    Patient ID: Lori Copeland, female    DOB: 1970-04-29, 53 y.o.   MRN: [id number]  HPI Virtual Visit via Video Note  I connected with the patient on 09/02/23 at 11:00 AM EDT by a video enabled telemedicine application and verified that I am speaking with the correct person using two identifiers.  Location patient: home Location provider:work or home office Persons participating in the virtual visit: patient, provider  I discussed the limitations of evaluation and management by telemedicine and the availability of in person appointments. The patient expressed understanding and agreed to proceed.   HPI: Here to follow up on ADHD and anxiety. She feels great and she wants to continue on the same medications. No side effects to report.    ROS: See pertinent positives and negatives per HPI.  Past Medical History:  Diagnosis Date   ADHD (attention deficit hyperactivity disorder), combined type 2021   Allergy    Chest pain 01/07/2010   normal work up, non cardiac     Past Surgical History:  Procedure Laterality Date   ABDOMINAL HYSTERECTOMY  1999   left ovary still intact    APPENDECTOMY     BREAST ENHANCEMENT SURGERY     DILATION AND CURETTAGE OF UTERUS  1999   ESOPHAGOGASTRODUODENOSCOPY  2010   normal    Family History  Problem Relation Age of Onset   Cancer Mother    Alcohol abuse Mother    Stomach cancer Mother    Diabetes Father    Diabetes Paternal Grandmother    Ovarian cancer Sister      Current Outpatient Medications:    ADDERALL  XR 30 MG 24 hr capsule, Take 1 capsule (30 mg total) by mouth daily., Disp: 30 capsule, Rfl: 0   ADDERALL  XR 30 MG 24 hr capsule, Take 1 capsule (30 mg total) by mouth daily., Disp: 30 capsule, Rfl: 0   ADDERALL  XR 30 MG 24 hr capsule, Take 1 capsule (30 mg total) by mouth daily., Disp: 30 capsule, Rfl: 0   buPROPion  (WELLBUTRIN  XL) 150 MG 24 hr tablet, Take 1 tablet (150 mg total) by mouth daily., Disp: 90 tablet,  Rfl: 3  EXAM:  VITALS per patient if applicable:  GENERAL: alert, oriented, appears well and in no acute distress  HEENT: atraumatic, conjunttiva clear, no obvious abnormalities on inspection of external nose and ears  NECK: normal movements of the head and neck  LUNGS: on inspection no signs of respiratory distress, breathing rate appears normal, no obvious gross SOB, gasping or wheezing  CV: no obvious cyanosis  MS: moves all visible extremities without noticeable abnormality  PSYCH/NEURO: pleasant and cooperative, no obvious depression or anxiety, speech and thought processing grossly intact  ASSESSMENT AND PLAN: ADHD and anxiety, well controlled. Meds were refilled.  Lori Olmsted, MD   Discussed the following assessment and plan:  No diagnosis found.     I discussed the assessment and treatment plan with the patient. The patient was provided an opportunity to ask questions and all were answered. The patient agreed with the plan and demonstrated an understanding of the instructions.   The patient was advised to call back or seek an in-person evaluation if the symptoms worsen or if the condition fails to improve as anticipated.      Review of Systems     Objective:   Physical Exam        Assessment & Plan:

## 2024-01-05 ENCOUNTER — Encounter: Payer: PRIVATE HEALTH INSURANCE | Admitting: Family Medicine

## 2024-01-12 ENCOUNTER — Ambulatory Visit: Payer: Self-pay | Admitting: Family Medicine

## 2024-01-12 ENCOUNTER — Ambulatory Visit: Payer: PRIVATE HEALTH INSURANCE | Admitting: Family Medicine

## 2024-01-12 ENCOUNTER — Encounter: Payer: Self-pay | Admitting: Family Medicine

## 2024-01-12 VITALS — BP 100/70 | HR 65 | Temp 98.7°F | Ht 70.75 in | Wt 205.0 lb

## 2024-01-12 DIAGNOSIS — Z0000 Encounter for general adult medical examination without abnormal findings: Secondary | ICD-10-CM | POA: Diagnosis not present

## 2024-01-12 DIAGNOSIS — F419 Anxiety disorder, unspecified: Secondary | ICD-10-CM | POA: Diagnosis not present

## 2024-01-12 LAB — BASIC METABOLIC PANEL WITH GFR
BUN: 17 mg/dL (ref 6–23)
CO2: 28 meq/L (ref 19–32)
Calcium: 9.4 mg/dL (ref 8.4–10.5)
Chloride: 104 meq/L (ref 96–112)
Creatinine, Ser: 0.91 mg/dL (ref 0.40–1.20)
GFR: 73.02 mL/min
Glucose, Bld: 89 mg/dL (ref 70–99)
Potassium: 4.2 meq/L (ref 3.5–5.1)
Sodium: 138 meq/L (ref 135–145)

## 2024-01-12 LAB — CBC WITH DIFFERENTIAL/PLATELET
Basophils Absolute: 0 K/uL (ref 0.0–0.1)
Basophils Relative: 0.6 % (ref 0.0–3.0)
Eosinophils Absolute: 0.1 K/uL (ref 0.0–0.7)
Eosinophils Relative: 1.8 % (ref 0.0–5.0)
HCT: 39 % (ref 36.0–46.0)
Hemoglobin: 13.3 g/dL (ref 12.0–15.0)
Lymphocytes Relative: 41.3 % (ref 12.0–46.0)
Lymphs Abs: 2.4 K/uL (ref 0.7–4.0)
MCHC: 34 g/dL (ref 30.0–36.0)
MCV: 98.7 fl (ref 78.0–100.0)
Monocytes Absolute: 0.4 K/uL (ref 0.1–1.0)
Monocytes Relative: 6.5 % (ref 3.0–12.0)
Neutro Abs: 2.9 K/uL (ref 1.4–7.7)
Neutrophils Relative %: 49.8 % (ref 43.0–77.0)
Platelets: 243 K/uL (ref 150.0–400.0)
RBC: 3.95 Mil/uL (ref 3.87–5.11)
RDW: 12.5 % (ref 11.5–15.5)
WBC: 5.9 K/uL (ref 4.0–10.5)

## 2024-01-12 LAB — LIPID PANEL
Cholesterol: 169 mg/dL (ref 28–200)
HDL: 50.7 mg/dL
LDL Cholesterol: 99 mg/dL (ref 10–99)
NonHDL: 117.86
Total CHOL/HDL Ratio: 3
Triglycerides: 92 mg/dL (ref 10.0–149.0)
VLDL: 18.4 mg/dL (ref 0.0–40.0)

## 2024-01-12 LAB — HEPATIC FUNCTION PANEL
ALT: 19 U/L (ref 3–35)
AST: 21 U/L (ref 5–37)
Albumin: 4.5 g/dL (ref 3.5–5.2)
Alkaline Phosphatase: 120 U/L — ABNORMAL HIGH (ref 39–117)
Bilirubin, Direct: 0.1 mg/dL (ref 0.1–0.3)
Total Bilirubin: 0.4 mg/dL (ref 0.2–1.2)
Total Protein: 7.8 g/dL (ref 6.0–8.3)

## 2024-01-12 LAB — TSH: TSH: 2.58 u[IU]/mL (ref 0.35–5.50)

## 2024-01-12 LAB — HEMOGLOBIN A1C: Hgb A1c MFr Bld: 5.4 % (ref 4.6–6.5)

## 2024-01-12 MED ORDER — ADDERALL XR 30 MG PO CP24: 30.0000 mg | ORAL_CAPSULE | Freq: Every day | ORAL | 0 refills | Status: AC

## 2024-01-12 MED ORDER — BUPROPION HCL ER (XL) 150 MG PO TB24: 150.0000 mg | ORAL_TABLET | Freq: Every day | ORAL | 3 refills | Status: AC

## 2024-01-12 MED ORDER — TIRZEPATIDE-WEIGHT MANAGEMENT 2.5 MG/0.5ML ~~LOC~~ SOLN: 2.5000 mg | SUBCUTANEOUS | Status: AC

## 2024-01-12 NOTE — Progress Notes (Signed)
 "  Subjective:    Patient ID: Lori Copeland, female    DOB: 23-Jan-1970, 54 y.o.   MRN: [id number]  HPI Here for a well exam. She feels well. She has been working with the [facility], and she has been taking Zepbound  shots for the past month. She is doing well with them and is having no side effects. 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    Review of Systems  Constitutional: Negative.   HENT: Negative.    Eyes: Negative.   Respiratory: Negative.    Cardiovascular: Negative.   Gastrointestinal: Negative.   Genitourinary:  Negative for decreased urine volume, difficulty urinating, dyspareunia, dysuria, enuresis, flank pain, frequency, hematuria, pelvic pain and urgency.  Musculoskeletal: Negative.   Skin: Negative.   Neurological: Negative.  Negative for headaches.  Psychiatric/Behavioral: Negative.         Objective:   Physical Exam Constitutional:      General: She is not in acute distress.    Appearance: She is well-developed. She is obese.  HENT:     Head: Normocephalic and atraumatic.     Right Ear: External ear normal.     Left Ear: External ear normal.     Nose: Nose normal.     Mouth/Throat:     Pharynx: No oropharyngeal exudate.  Eyes:     General: No scleral icterus.    Conjunctiva/sclera: Conjunctivae normal.     Pupils: Pupils are equal, round, and reactive to light.  Neck:     Thyroid : No thyromegaly.     Vascular: No JVD.  Cardiovascular:     Rate and Rhythm: Normal rate and regular rhythm.     Pulses: Normal pulses.     Heart sounds: Normal heart sounds. No murmur heard.    No friction rub. No gallop.  Pulmonary:     Effort: Pulmonary effort is normal. No respiratory distress.     Breath sounds: Normal breath sounds. No wheezing or rales.  Chest:     Chest wall: No tenderness.  Abdominal:     General: Bowel sounds are normal. There is no distension.     Palpations: Abdomen is  soft. There is no mass.     Tenderness: There is no abdominal tenderness. There is no guarding or rebound.  Musculoskeletal:        General: No tenderness. Normal range of motion.     Cervical back: Normal range of motion and neck supple.  Lymphadenopathy:     Cervical: No cervical adenopathy.  Skin:    General: Skin is warm and dry.     Findings: No erythema or rash.  Neurological:     General: No focal deficit present.     Mental Status: She is alert and oriented to person, place, and time.     Cranial Nerves: No cranial nerve deficit.     Motor: No abnormal muscle tone.     Coordination: Coordination normal.     Deep Tendon Reflexes: Reflexes are normal and symmetric. Reflexes normal.  Psychiatric:        Mood and Affect: Mood normal.        Behavior: Behavior normal.        Thought Content: Thought content normal.        Judgment: Judgment normal.           Assessment & Plan:  Well exam. We discussed diet and exercise. Get fasting labs.  [name], MD   "
